# Patient Record
Sex: Female | Born: 1995 | Race: White | Hispanic: No | Marital: Single | Smoking: Current every day smoker
Health system: Southern US, Community
[De-identification: ages and names within clinical notes are randomized; demographics above are authoritative.]

## PROBLEM LIST (undated history)

## (undated) DIAGNOSIS — T7840XA Allergy, unspecified, initial encounter: Secondary | ICD-10-CM

## (undated) DIAGNOSIS — J45909 Unspecified asthma, uncomplicated: Secondary | ICD-10-CM

## (undated) DIAGNOSIS — L309 Dermatitis, unspecified: Secondary | ICD-10-CM

## (undated) DIAGNOSIS — M4646 Discitis, unspecified, lumbar region: Secondary | ICD-10-CM

## (undated) HISTORY — DX: Unspecified asthma, uncomplicated: J45.909

## (undated) HISTORY — DX: Dermatitis, unspecified: L30.9

## (undated) HISTORY — DX: Allergy, unspecified, initial encounter: T78.40XA

## (undated) HISTORY — DX: Discitis, unspecified, lumbar region: M46.46

---
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
## 2014-01-11 ENCOUNTER — Ambulatory Visit (INDEPENDENT_AMBULATORY_CARE_PROVIDER_SITE_OTHER): Payer: BC Managed Care – PPO | Admitting: Family Medicine

## 2014-01-11 ENCOUNTER — Encounter: Payer: Self-pay | Admitting: Family Medicine

## 2014-01-11 VITALS — BP 114/77 | HR 82 | Temp 97.1°F | Ht 64.25 in | Wt 160.0 lb

## 2014-01-11 DIAGNOSIS — T7840XA Allergy, unspecified, initial encounter: Secondary | ICD-10-CM

## 2014-01-11 MED ORDER — METHYLPREDNISOLONE ACETATE 80 MG/ML IJ SUSP
80.0000 mg | Freq: Once | INTRAMUSCULAR | Status: AC
  Administered 2014-01-11: 80 mg via INTRAMUSCULAR

## 2014-01-11 MED ORDER — METHYLPREDNISOLONE (PAK) 4 MG PO TABS: ORAL_TABLET | ORAL | Status: DC

## 2014-01-11 NOTE — Progress Notes (Signed)
   Subjective:    Patient ID: Janet White, female    DOB: [birth year], 18 y.o.   MRN: [id number]  HPI This 18 y.o. female presents for evaluation of rash and allergic rxn to some laundry detergent over the weekend.  She has been swelling in face.   Review of Systems C/o rash   No chest pain, SOB, HA, dizziness, vision change, N/V, diarrhea, constipation, dysuria, urinary urgency or frequency, myalgias, arthralgias or rash.  Objective:   Physical Exam  Vital signs noted  Well developed well nourished female.  HEENT - Head atraumatic Normocephalic                Eyes - PERRLA, Conjuctiva - clear Sclera- Clear EOMI                Ears - EAC's Wnl TM's Wnl Gross Hearing WNL                Throat - oropharanx wnl Respiratory - Lungs CTA bilateral Cardiac - RRR S1 and S2 without murmur GI - Abdomen soft Nontender and bowel sounds active x 4 Extremities - No edema. Neuro - Grossly intact. Skin - Face with swelling and bilateral antecubital region with erythematous dry rash     Assessment & Plan:  Allergic reaction, initial encounter - Plan: methylPREDNISolone acetate (DEPO-MEDROL) injection 80 mg, methylPREDNIsolone (MEDROL DOSPACK) 4 MG tablet  Take benadryl otc and discussed using epi pen when having angio edema or tongue swelling.  Deatra CanterWilliam J Clevland Cork FNP

## 2014-01-28 ENCOUNTER — Telehealth: Payer: Self-pay | Admitting: Nurse Practitioner

## 2014-01-28 NOTE — Telephone Encounter (Signed)
Need to see rash because if it is folliculitis that normally occurs in that area a steroid cream will not help -May need antibiotic- Can try OTC steroid cream and if doesn't help let me know

## 2014-01-28 NOTE — Telephone Encounter (Signed)
She has a rash between her legs that is flared up bad and they want to know if you can call her in a steroid cream. We have no available appts today

## 2014-01-28 NOTE — Telephone Encounter (Signed)
Patient aware appointment made 

## 2014-01-29 ENCOUNTER — Encounter (INDEPENDENT_AMBULATORY_CARE_PROVIDER_SITE_OTHER): Payer: Self-pay

## 2014-01-29 ENCOUNTER — Ambulatory Visit (INDEPENDENT_AMBULATORY_CARE_PROVIDER_SITE_OTHER): Payer: BC Managed Care – PPO | Admitting: Nurse Practitioner

## 2014-01-29 ENCOUNTER — Telehealth: Payer: Self-pay

## 2014-01-29 ENCOUNTER — Encounter: Payer: Self-pay | Admitting: Nurse Practitioner

## 2014-01-29 VITALS — BP 104/67 | HR 98 | Temp 96.9°F | Ht 64.25 in | Wt 158.0 lb

## 2014-01-29 DIAGNOSIS — L259 Unspecified contact dermatitis, unspecified cause: Secondary | ICD-10-CM

## 2014-01-29 DIAGNOSIS — L309 Dermatitis, unspecified: Secondary | ICD-10-CM

## 2014-01-29 MED ORDER — PREDNISONE 20 MG PO TABS: ORAL_TABLET | ORAL | Status: DC

## 2014-01-29 MED ORDER — METHYLPREDNISOLONE SODIUM SUCC 125 MG IJ SOLR
125.0000 mg | Freq: Once | INTRAMUSCULAR | Status: AC
  Administered 2014-01-29: 125 mg via INTRAMUSCULAR

## 2014-01-29 NOTE — Progress Notes (Signed)
   Subjective:    Patient ID: Charleston RopesMary Janet White, female    DOB: 1996/02/02, 18 y.o.   MRN: [id number]  HPI Patien tin c/o rash that started yesterday morning- Mainly between legs and on back of legs- very itchy.    Review of Systems  Constitutional: Negative.   HENT: Negative.   Respiratory: Negative.   Cardiovascular: Negative.   Genitourinary: Negative.   Neurological: Negative.   Psychiatric/Behavioral: Negative.   All other systems reviewed and are negative.      Objective:   Physical Exam  Constitutional: She is oriented to person, place, and time. She appears well-developed and well-nourished.  Cardiovascular: Normal rate, regular rhythm and normal heart sounds.   Pulmonary/Chest: Effort normal and breath sounds normal.  Neurological: She is alert and oriented to person, place, and time.  Skin: Skin is warm. Rash noted.  Erythematous whelpy rash on bil legs   BP 104/67  Pulse 98  Temp(Src) 96.9 F (36.1 C) (Oral)  Ht 5' 4.25" (1.632 m)  Wt 158 lb (71.668 kg)  BMI 26.91 kg/m2  LMP 01/08/2014        Assessment & Plan:  1. Contact dermatitis and other eczema, due to unspecified cause Avoid scratching Benadryl OTC if helps with itching - methylPREDNISolone sodium succinate (SOLU-MEDROL) 125 mg/2 mL injection 125 mg; Inject 2 mLs (125 mg total) into the muscle once. - predniSONE (DELTASONE) 20 MG tablet; 2 po qd x 5 days  Dispense: 10 tablet; Refill: 0  2. Eczema Continue current treatments - Ambulatory referral to Dermatology  Janet Daphine DeutscherMartin, FNP

## 2014-01-29 NOTE — Patient Instructions (Signed)
Eczema Eczema, also called atopic dermatitis, is a skin disorder that causes inflammation of the skin. It causes a red rash and dry, scaly skin. The skin becomes very itchy. Eczema is generally worse during the cooler winter months and often improves with the warmth of summer. Eczema usually starts showing signs in infancy. Some children outgrow eczema, but it may last through adulthood.  CAUSES  The exact cause of eczema is not known, but it appears to run in families. People with eczema often have a family history of eczema, allergies, asthma, or hay fever. Eczema is not contagious. Flare-ups of the condition may be caused by:   Contact with something you are sensitive or allergic to.   Stress. SIGNS AND SYMPTOMS  Dry, scaly skin.   Red, itchy rash.   Itchiness. This may occur before the skin rash and may be very intense.  DIAGNOSIS  The diagnosis of eczema is usually made based on symptoms and medical history. TREATMENT  Eczema cannot be cured, but symptoms usually can be controlled with treatment and other strategies. A treatment plan might include:  Controlling the itching and scratching.   Use over-the-counter antihistamines as directed for itching. This is especially useful at night when the itching tends to be worse.   Use over-the-counter steroid creams as directed for itching.   Avoid scratching. Scratching makes the rash and itching worse. It may also result in a skin infection (impetigo) due to a break in the skin caused by scratching.   Keeping the skin well moisturized with creams every day. This will seal in moisture and help prevent dryness. Lotions that contain alcohol and water should be avoided because they can dry the skin.   Limiting exposure to things that you are sensitive or allergic to (allergens).   Recognizing situations that cause stress.   Developing a plan to manage stress.  HOME CARE INSTRUCTIONS   Only take over-the-counter or  prescription medicines as directed by your health care provider.   Do not use anything on the skin without checking with your health care provider.   Keep baths or showers short (5 minutes) in warm (not hot) water. Use mild cleansers for bathing. These should be unscented. You may add nonperfumed bath oil to the bath water. It is best to avoid soap and bubble bath.   Immediately after a bath or shower, when the skin is still damp, apply a moisturizing ointment to the entire body. This ointment should be a petroleum ointment. This will seal in moisture and help prevent dryness. The thicker the ointment, the better. These should be unscented.   Keep fingernails cut short. Children with eczema may need to wear soft gloves or mittens at night after applying an ointment.   Dress in clothes made of cotton or cotton blends. Dress lightly, because heat increases itching.   A child with eczema should stay away from anyone with fever blisters or cold sores. The virus that causes fever blisters (herpes simplex) can cause a serious skin infection in children with eczema. SEEK MEDICAL CARE IF:   Your itching interferes with sleep.   Your rash gets worse or is not better within 1 week after starting treatment.   You see pus or soft yellow scabs in the rash area.   You have a fever.   You have a rash flare-up after contact with someone who has fever blisters.  Document Released: 06/01/2000 Document Revised: 03/25/2013 Document Reviewed: 01/05/2013 [employer] Patient Information 2015 [employer]. This information   is not intended to replace advice given to you by your health care provider. Make sure you discuss any questions you have with your health care provider.  

## 2014-01-29 NOTE — Telephone Encounter (Signed)
[facility] to Referrals-[facility]

## 2014-02-15 ENCOUNTER — Encounter: Payer: Self-pay | Admitting: Nurse Practitioner

## 2014-02-15 ENCOUNTER — Ambulatory Visit (INDEPENDENT_AMBULATORY_CARE_PROVIDER_SITE_OTHER): Payer: BC Managed Care – PPO | Admitting: Nurse Practitioner

## 2014-02-15 VITALS — BP 120/73 | HR 91 | Temp 98.3°F | Ht 64.0 in | Wt 160.0 lb

## 2014-02-15 DIAGNOSIS — L309 Dermatitis, unspecified: Secondary | ICD-10-CM

## 2014-02-15 DIAGNOSIS — L259 Unspecified contact dermatitis, unspecified cause: Secondary | ICD-10-CM

## 2014-02-15 MED ORDER — TRIAMCINOLONE ACETONIDE 0.1 % EX CREA: 1.0000 "application " | TOPICAL_CREAM | Freq: Two times a day (BID) | CUTANEOUS | Status: DC

## 2014-02-15 NOTE — Patient Instructions (Signed)
Eczema Eczema, also called atopic dermatitis, is a skin disorder that causes inflammation of the skin. It causes a red rash and dry, scaly skin. The skin becomes very itchy. Eczema is generally worse during the cooler winter months and often improves with the warmth of summer. Eczema usually starts showing signs in infancy. Some children outgrow eczema, but it may last through adulthood.  CAUSES  The exact cause of eczema is not known, but it appears to run in families. People with eczema often have a family history of eczema, allergies, asthma, or hay fever. Eczema is not contagious. Flare-ups of the condition may be caused by:   Contact with something you are sensitive or allergic to.   Stress. SIGNS AND SYMPTOMS  Dry, scaly skin.   Red, itchy rash.   Itchiness. This may occur before the skin rash and may be very intense.  DIAGNOSIS  The diagnosis of eczema is usually made based on symptoms and medical history. TREATMENT  Eczema cannot be cured, but symptoms usually can be controlled with treatment and other strategies. A treatment plan might include:  Controlling the itching and scratching.   Use over-the-counter antihistamines as directed for itching. This is especially useful at night when the itching tends to be worse.   Use over-the-counter steroid creams as directed for itching.   Avoid scratching. Scratching makes the rash and itching worse. It may also result in a skin infection (impetigo) due to a break in the skin caused by scratching.   Keeping the skin well moisturized with creams every day. This will seal in moisture and help prevent dryness. Lotions that contain alcohol and water should be avoided because they can dry the skin.   Limiting exposure to things that you are sensitive or allergic to (allergens).   Recognizing situations that cause stress.   Developing a plan to manage stress.  HOME CARE INSTRUCTIONS   Only take over-the-counter or  prescription medicines as directed by your health care provider.   Do not use anything on the skin without checking with your health care provider.   Keep baths or showers short (5 minutes) in warm (not hot) water. Use mild cleansers for bathing. These should be unscented. You may add nonperfumed bath oil to the bath water. It is best to avoid soap and bubble bath.   Immediately after a bath or shower, when the skin is still damp, apply a moisturizing ointment to the entire body. This ointment should be a petroleum ointment. This will seal in moisture and help prevent dryness. The thicker the ointment, the better. These should be unscented.   Keep fingernails cut short. Children with eczema may need to wear soft gloves or mittens at night after applying an ointment.   Dress in clothes made of cotton or cotton blends. Dress lightly, because heat increases itching.   A child with eczema should stay away from anyone with fever blisters or cold sores. The virus that causes fever blisters (herpes simplex) can cause a serious skin infection in children with eczema. SEEK MEDICAL CARE IF:   Your itching interferes with sleep.   Your rash gets worse or is not better within 1 week after starting treatment.   You see pus or soft yellow scabs in the rash area.   You have a fever.   You have a rash flare-up after contact with someone who has fever blisters.  Document Released: 06/01/2000 Document Revised: 03/25/2013 Document Reviewed: 01/05/2013 [employer] Patient Information 2015 [employer]. This information   is not intended to replace advice given to you by your health care provider. Make sure you discuss any questions you have with your health care provider.  

## 2014-02-15 NOTE — Progress Notes (Signed)
   Subjective:    Patient ID: Charleston Ropes, female    DOB: January 07, 1996, 18 y.o.   MRN: [id number]  HPI Patient in today with a flare up of her eczema- mainly i n anticubital areas.    Review of Systems  Constitutional: Negative.   HENT: Negative.   Respiratory: Negative.   Cardiovascular: Negative.   Genitourinary: Negative.   Neurological: Negative.   Psychiatric/Behavioral: Negative.   All other systems reviewed and are negative.      Objective:   Physical Exam  Constitutional: She is oriented to person, place, and time. She appears well-developed and well-nourished.  Cardiovascular: Normal rate, regular rhythm and normal heart sounds.   Neurological: She is alert and oriented to person, place, and time.  Skin: Skin is warm.  Erythematous dry patchy area to bil antecubital area  Psychiatric: She has a normal mood and affect. Her behavior is normal. Judgment and thought content normal.   BP 120/73  Pulse 91  Temp(Src) 98.3 F (36.8 C) (Oral)  Ht  (1.626 m)  Wt 160 lb (72.576 kg)  BMI 27.45 kg/m2        Assessment & Plan:   1. Eczema    Meds ordered this encounter  Medications  . triamcinolone cream (KENALOG) 0.1 %    Sig: Apply 1 application topically 2 (two) times daily.    Dispense:  85.2 g    Refill:  2    Order Specific Question:  Supervising Provider    Answer:  Ernestina Penna [[id number]]   Avoid taking hot baths RTO prn- follow up with dermatology  Kaleisha-Margaret Daphine Deutscher, FNP

## 2014-03-13 ENCOUNTER — Other Ambulatory Visit: Payer: Self-pay | Admitting: Nurse Practitioner

## 2014-08-08 ENCOUNTER — Other Ambulatory Visit: Payer: Self-pay | Admitting: Physician Assistant

## 2014-08-09 NOTE — Telephone Encounter (Signed)
Last seen 02/15/14 MMM

## 2014-10-17 ENCOUNTER — Other Ambulatory Visit: Payer: Self-pay | Admitting: Nurse Practitioner

## 2014-11-05 ENCOUNTER — Encounter (INDEPENDENT_AMBULATORY_CARE_PROVIDER_SITE_OTHER): Payer: Self-pay

## 2014-11-05 ENCOUNTER — Ambulatory Visit (INDEPENDENT_AMBULATORY_CARE_PROVIDER_SITE_OTHER): Payer: BC Managed Care – PPO | Admitting: Nurse Practitioner

## 2014-11-05 ENCOUNTER — Encounter: Payer: Self-pay | Admitting: Nurse Practitioner

## 2014-11-05 VITALS — BP 110/66 | HR 82 | Temp 96.8°F | Ht 64.0 in | Wt 158.0 lb

## 2014-11-05 DIAGNOSIS — J029 Acute pharyngitis, unspecified: Secondary | ICD-10-CM

## 2014-11-05 DIAGNOSIS — J309 Allergic rhinitis, unspecified: Secondary | ICD-10-CM

## 2014-11-05 LAB — POCT RAPID STREP A (OFFICE): Rapid Strep A Screen: NEGATIVE

## 2014-11-05 MED ORDER — FLUTICASONE PROPIONATE 50 MCG/ACT NA SUSP: 2.0000 | Freq: Every day | NASAL | Status: DC

## 2014-11-05 MED ORDER — METHYLPREDNISOLONE ACETATE 80 MG/ML IJ SUSP
80.0000 mg | Freq: Once | INTRAMUSCULAR | Status: AC
  Administered 2014-11-05: 80 mg via INTRAMUSCULAR

## 2014-11-05 NOTE — Progress Notes (Signed)
  Subjective:     Charleston RopesMary Graley is a [age] y.o. female who presents for evaluation of sore throat. Associated symptoms include headache, nasal blockage, post nasal drip, sinus and nasal congestion and sore throat. Onset of symptoms was 2 days ago, and have been unchanged since that time. She is drinking plenty of fluids. She has not had a recent close exposure to someone with proven streptococcal pharyngitis.  The following portions of the patient's history were reviewed and updated as appropriate: allergies, current medications, past family history, past medical history, past social history, past surgical history and problem list.  Review of Systems Pertinent items are noted in HPI.    Objective:    BP 110/66 mmHg  Pulse 82  Temp(Src) 96.8 F (36 C) (Oral)  Ht 5\' 4"  (1.626 m)  Wt 158 lb (71.668 kg)  BMI 27.11 kg/m2 General appearance: alert and cooperative Eyes: conjunctivae/corneas clear. PERRL, EOM's intact. Fundi benign. Ears: normal TM's and external ear canals both ears Nose: Nares normal. Septum midline. Mucosa normal. No drainage or sinus tenderness. Throat: lips, mucosa, and tongue normal; teeth and gums normal and posterior oral pharynx erythematous Neck: no adenopathy, no carotid bruit, no JVD, supple, symmetrical, trachea midline and thyroid not enlarged, symmetric, no tenderness/mass/nodules Lungs: clear to auscultation bilaterally Heart: regular rate and rhythm, S1, S2 normal, no murmur, click, rub or gallop  Laboratory Strep test done. Results:.    Results for orders placed or performed in visit on 11/05/14  POCT rapid strep A  Result Value Ref Range   Rapid Strep A Screen Negative Negative     Assessment:    Acute pharyngitis,   Plan:  1. Take meds as prescribed 2. Use a cool mist humidifier especially during the winter months and when heat has been humid. 3. Use saline nose sprays frequently 4. Saline irrigations of the nose can be very helpful if done  frequently.  * 4X daily for 1 week*  * Use of a nettie pot can be helpful with this. Follow directions with this* 5. Drink plenty of fluids 6. Keep thermostat turn down low 7.For any cough or congestion  Use plain Mucinex- regular strength or max strength is fine   * Children- consult with Pharmacist for dosing 8. For fever or aces or pains- take tylenol or ibuprofen appropriate for age and weight.  * for fevers greater than 101 orally you may alternate ibuprofen and tylenol every  3 hours.   Meds ordered this encounter  Medications  . methylPREDNISolone acetate (DEPO-MEDROL) injection 80 mg    Sig:   . fluticasone (FLONASE) 50 MCG/ACT nasal spray    Sig: Place 2 sprays into both nostrils daily.    Dispense:  16 g    Refill:  6    Order Specific Question:  Supervising Provider    Answer:  Ernestina PennaMOORE, DONALD W [[id number]]   Lynnix-Margaret Daphine DeutscherMartin, FNP

## 2014-11-05 NOTE — Patient Instructions (Signed)

## 2014-11-09 ENCOUNTER — Telehealth: Payer: Self-pay | Admitting: Nurse Practitioner

## 2014-11-09 NOTE — Telephone Encounter (Signed)
Patient mother aware. 

## 2014-11-09 NOTE — Telephone Encounter (Signed)
If having no fever needs to take a decongestant otc

## 2014-11-18 ENCOUNTER — Other Ambulatory Visit: Payer: Self-pay | Admitting: Physician Assistant

## 2014-11-18 DIAGNOSIS — L209 Atopic dermatitis, unspecified: Secondary | ICD-10-CM

## 2014-11-18 DIAGNOSIS — L089 Local infection of the skin and subcutaneous tissue, unspecified: Secondary | ICD-10-CM

## 2014-11-18 DIAGNOSIS — B958 Unspecified staphylococcus as the cause of diseases classified elsewhere: Secondary | ICD-10-CM

## 2014-11-18 MED ORDER — CLOBETASOL PROPIONATE 0.05 % EX OINT: TOPICAL_OINTMENT | Freq: Two times a day (BID) | CUTANEOUS | Status: DC

## 2014-11-18 MED ORDER — TRIAMCINOLONE ACETONIDE 0.1 % EX CREA: 1.0000 "application " | TOPICAL_CREAM | Freq: Two times a day (BID) | CUTANEOUS | Status: DC

## 2014-11-18 MED ORDER — DOXYCYCLINE HYCLATE 100 MG PO TABS: 100.0000 mg | ORAL_TABLET | Freq: Two times a day (BID) | ORAL | Status: DC

## 2014-11-18 MED ORDER — HYDROXYZINE HCL 25 MG PO TABS: ORAL_TABLET | ORAL | Status: DC

## 2014-12-14 ENCOUNTER — Other Ambulatory Visit: Payer: Self-pay | Admitting: Nurse Practitioner

## 2015-02-11 ENCOUNTER — Encounter (INDEPENDENT_AMBULATORY_CARE_PROVIDER_SITE_OTHER): Payer: Self-pay

## 2015-02-11 ENCOUNTER — Encounter: Payer: Self-pay | Admitting: Physician Assistant

## 2015-02-11 ENCOUNTER — Ambulatory Visit: Payer: BLUE CROSS/BLUE SHIELD | Admitting: Physician Assistant

## 2015-02-11 VITALS — BP 116/67 | HR 83 | Temp 97.1°F | Ht 64.01 in | Wt 156.0 lb

## 2015-02-11 DIAGNOSIS — R5383 Other fatigue: Secondary | ICD-10-CM

## 2015-02-11 DIAGNOSIS — B360 Pityriasis versicolor: Secondary | ICD-10-CM

## 2015-02-11 DIAGNOSIS — L089 Local infection of the skin and subcutaneous tissue, unspecified: Secondary | ICD-10-CM

## 2015-02-11 DIAGNOSIS — L209 Atopic dermatitis, unspecified: Secondary | ICD-10-CM

## 2015-02-11 MED ORDER — CIPROFLOXACIN HCL 500 MG PO TABS: 500.0000 mg | ORAL_TABLET | Freq: Two times a day (BID) | ORAL | Status: DC

## 2015-02-11 MED ORDER — FLUCONAZOLE 150 MG PO TABS: ORAL_TABLET | ORAL | Status: DC

## 2015-02-11 MED ORDER — SULFAMETHOXAZOLE-TRIMETHOPRIM 800-160 MG PO TABS: 1.0000 | ORAL_TABLET | Freq: Two times a day (BID) | ORAL | Status: DC

## 2015-02-11 MED ORDER — MUPIROCIN 2 % EX OINT: TOPICAL_OINTMENT | CUTANEOUS | Status: DC

## 2015-02-11 MED ORDER — TACROLIMUS 0.1 % EX OINT
TOPICAL_OINTMENT | Freq: Two times a day (BID) | CUTANEOUS | Status: DC
Start: 2015-02-11 — End: 2015-04-25

## 2015-02-11 MED ORDER — CLOBETASOL PROPIONATE 0.05 % EX OINT: TOPICAL_OINTMENT | Freq: Two times a day (BID) | CUTANEOUS | Status: DC

## 2015-02-11 NOTE — Patient Instructions (Addendum)
Take both antibiotics x 10 days - Cipro and Bactrim Use Clobetasol twice daily on hands and the worst areas of eczema x 14 - change to triamcinolone twice daily  For maintenance, use Triamcinolone and Protopic Mupirocin/Bactroban Ointment - use on areas of skin that breakdown and could become or look infected   Dove soap  Yeast Infection of the Skin Some yeast on the skin is normal, but sometimes it causes an infection. If you have a yeast infection, it shows up as white or light brown patches on brown skin. You can see it better in the summer on tan skin. It causes light-colored holes in your suntan. It can happen on any area of the body. This cannot be passed from person to person. HOME CARE  Scrub your skin daily with a dandruff shampoo. Your rash may take a couple weeks to get well.  Do not scratch or itch the rash. GET HELP RIGHT AWAY IF:   You get another infection from scratching. The skin may get warm, red, and may ooze fluid.  The infection does not seem to be getting better. MAKE SURE YOU:  Understand these instructions.  Will watch your condition.  Will get help right away if you are not doing well or get worse. Document Released: 05/17/2008 Document Revised: 08/27/2011 Document Reviewed: 05/17/2008 [facility] Patient Information 2015 [place], [place]. This information is not intended to replace advice given to you by your health care provider. Make sure you discuss any questions you have with your health care provider.

## 2015-02-11 NOTE — Progress Notes (Signed)
   Subjective:    Patient ID: Janet White, female    DOB: 1995-08-24, 19 y.o.   MRN: [id number]  HPI 19 y/o with h/o eczema presents with flare and skin irritation, mostly on hands.   C/O rash on BLE, trunk x several months, spreading.   She also has c/o increased fatigue. She states that she is sleeping 12 hours a day and still tired.   Review of Systems  Skin:       Rash on BLE and trunk, light colored splotchy Hands are cracking and very erythematous in areas, appears to have secondary infection          Objective:   Physical Exam  Skin:  Scaling plaques on BUE Cracking and fissures on bilateral hands due to irritation from washing dishes - hand dermatitis Large amount of erythema on hands, suspicious for secondary infection Hypopigment patches with scale on BLE and trunk          Assessment & Plan:  1. Atopic dermatitis  - tacrolimus (PROTOPIC) 0.1 % ointment; Apply topically 2 (two) times daily.  Dispense: 100 g; Refill: 3 - clobetasol ointment (TEMOVATE) 0.05 %; Apply topically 2 (two) times daily. X 14 days , then change to triamcinolone  Dispense: 60 g; Refill: 2  - bleach baths 2-3 times weekly   2. Tinea versicolor  - fluconazole (DIFLUCAN) 150 MG tablet; Take 1 tablet on the same day of the week x 6 weeks  Dispense: 6 tablet; Refill: 0 - OTC Nizoral shampoo - use as body wash daily   3. Skin infection - mupirocin ointment (BACTROBAN) 2 %; Apply to AA BID x 14 days  Dispense: 30 g; Refill: 0 - Bactrim DS BID x 10 days - Cipro  BID x 10 days Did not culture d/t patient not having insurance but provided double coverage based on past culture results.   4. Other fatigue - Patient will return when her insurance is in effect for further lab testing to r/o anemia, hypothyroidism   RTO if infection does not clear and symptoms worsen. Instructions were given to Use Clobetasol twice daily on hands and the worst areas of eczema x 14 - change to triamcinolone  twice daily   For maintenance, use Triamcinolone and Protopic Mupirocin/Bactroban Ointment - use on areas of skin that breakdown and could become or look infected   Ellenie Salome A. Chauncey Reading PA-C

## 2015-04-25 ENCOUNTER — Encounter: Payer: Self-pay | Admitting: Pediatrics

## 2015-04-25 ENCOUNTER — Telehealth: Payer: Self-pay | Admitting: Nurse Practitioner

## 2015-04-25 ENCOUNTER — Ambulatory Visit (INDEPENDENT_AMBULATORY_CARE_PROVIDER_SITE_OTHER): Payer: Self-pay | Admitting: Pediatrics

## 2015-04-25 ENCOUNTER — Ambulatory Visit: Payer: Self-pay

## 2015-04-25 VITALS — BP 113/73 | HR 89 | Temp 98.7°F | Ht 64.01 in | Wt 156.0 lb

## 2015-04-25 DIAGNOSIS — L509 Urticaria, unspecified: Secondary | ICD-10-CM

## 2015-04-25 DIAGNOSIS — L209 Atopic dermatitis, unspecified: Secondary | ICD-10-CM

## 2015-04-25 DIAGNOSIS — J4540 Moderate persistent asthma, uncomplicated: Secondary | ICD-10-CM

## 2015-04-25 DIAGNOSIS — L309 Dermatitis, unspecified: Secondary | ICD-10-CM

## 2015-04-25 MED ORDER — CLOBETASOL PROPIONATE 0.05 % EX OINT: 1.0000 "application " | TOPICAL_OINTMENT | Freq: Two times a day (BID) | CUTANEOUS | Status: DC

## 2015-04-25 MED ORDER — MONTELUKAST SODIUM 10 MG PO TABS: 10.0000 mg | ORAL_TABLET | Freq: Every day | ORAL | Status: DC

## 2015-04-25 MED ORDER — TRIAMCINOLONE ACETONIDE 0.1 % EX CREA: 1.0000 "application " | TOPICAL_CREAM | Freq: Two times a day (BID) | CUTANEOUS | Status: DC

## 2015-04-25 MED ORDER — FEXOFENADINE HCL 30 MG PO TABS: 180.0000 mg | ORAL_TABLET | Freq: Every day | ORAL | Status: DC

## 2015-04-25 MED ORDER — ALBUTEROL SULFATE HFA 108 (90 BASE) MCG/ACT IN AERS: 2.0000 | INHALATION_SPRAY | Freq: Four times a day (QID) | RESPIRATORY_TRACT | Status: DC | PRN

## 2015-04-25 MED ORDER — HYDROXYZINE HCL 25 MG PO TABS: ORAL_TABLET | ORAL | Status: DC

## 2015-04-25 MED ORDER — FEXOFENADINE HCL 30 MG PO TABS: 180.0000 mg | ORAL_TABLET | Freq: Two times a day (BID) | ORAL | Status: DC

## 2015-04-25 MED ORDER — PREDNISONE 10 MG (21) PO TBPK: 10.0000 mg | ORAL_TABLET | Freq: Every day | ORAL | Status: DC

## 2015-04-25 NOTE — Progress Notes (Signed)
Subjective:    Patient ID: Janet White, female    DOB: August 10, 1995, 19 y.o.   MRN: [id number]  CC: hives  HPI: Janet White is a 19 y.o. female presenting on 04/25/2015 for Urticaria  Started getting hives 7 days ago Very itchy Hives improve/worsen on different parts of body within hours Yesterday her lower back had many lesions, pt with photo on her phone, today it is more her legs Trying benadryl and triamcinolone at home H/o eczema H/o allergies, has congestion now No new detergents, foods, other new exposures Got hives two weeks ago that were similar, lasted a few days  Taking benadryl twice a day now Allegra daily  Relevant past medical, surgical, family and social history reviewed and updated as indicated. Interim medical history since our last visit reviewed. Allergies and medications reviewed and updated.   ROS: Per HPI unless specifically indicated above  Past Medical History Patient Active Problem List   Diagnosis Date Noted  . Eczema 12/12/2012    Current Outpatient Prescriptions  Medication Sig Dispense Refill  . albuterol (VENTOLIN HFA) 108 (90 BASE) MCG/ACT inhaler Inhale 2 puffs into the lungs every 6 (six) hours as needed for wheezing. 1 Inhaler 2  . EPINEPHrine (EPI-PEN) 0.3 mg/0.3 mL SOAJ Inject 0.3 mLs (0.3 mg total) into the muscle once. 1 Device 11  . FLOVENT HFA 110 MCG/ACT inhaler     . hydrOXYzine (ATARAX/VISTARIL) 25 MG tablet Take 1 tablet TID prn for itch 90 tablet 1  . montelukast (SINGULAIR) 10 MG tablet Take 1 tablet (10 mg total) by mouth at bedtime. 30 tablet 5  . triamcinolone cream (KENALOG) 0.1 % Apply 1 application topically 2 (two) times daily. 453.6 g 2  . clobetasol ointment (TEMOVATE) 0.05 % Apply 1 application topically 2 (two) times daily. 60 g 1  . fexofenadine (ALLEGRA) 30 MG tablet Take 6 tablets (180 mg total) by mouth daily. 1 tablet 0  . predniSONE (STERAPRED UNI-PAK 21 TAB) 10 MG (21) TBPK tablet Take 1 tablet (10 mg total)  by mouth daily. As directed x 6 days 21 tablet 0   No current facility-administered medications for this visit.       Objective:    BP 113/73 mmHg  Pulse 89  Temp(Src) 98.7 F (37.1 C) (Oral)  Ht 5' 4.01" (1.626 m)  Wt 156 lb (70.761 kg)  BMI 26.76 kg/m2  Wt Readings from Last 3 Encounters:  04/25/15 156 lb (70.761 kg)  02/11/15 156 lb (70.761 kg) (85 %*, Z = 1.02)  11/05/14 158 lb (71.668 kg) (86 %*, Z = 1.09)   * Growth percentiles are based on CDC 2-20 Years data.    Gen: NAD, alert, cooperative with exam, NCAT EYES: EOMI, no scleral injection or icterus ENT:  TMs pearly gray b/l, OP without erythema LYMPH: no cervical LAD CV: NRRR, normal S1/S2, no murmur, distal pulses 2+ b/l Resp: CTABL, no wheezes, normal WOB Abd: +BS, soft, NTND. no guarding or organomegaly Neuro: Alert and oriented Skin: raised, serpentine, urticarial plaques over fronts and backs of legs b/l, plaques coalescing together in some areas. With lichenified patches of skin over wrists b/l with excoriations     Assessment & Plan:   Janet White was seen today for acute urticaria, ongoing 1 week. Discussed possible etiologies, including exposures, viruses. Pt already on allegra, can take benadryl or atarax throughout day as needed for itching. Eczema not well controlled with triamcinolone, will try clobetasol. Will also give prednisone taper which can help  modulate histamine release in urticarial reactions. If symptoms continue will get her in to see allergy/immunology.  Diagnoses and all orders for this visit:  Eczema Not well controlled.  Try clobetasol, dont use on face or groin, thin skinned areas. Has enough triamcinolone at home to use as needed on those places. -     clobetasol ointment (TEMOVATE) 0.05 %; Apply 1 application topically 2 (two) times daily.  Hives See above, new problem. Not well controlled.  -     predniSONE (STERAPRED UNI-PAK 21 TAB) 10 MG (21) TBPK tablet; Take 1 tablet (10 mg total) by  mouth daily. As directed x 6 days -     fexofenadine (ALLEGRA) 30 MG tablet; Take 6 tablets (180 mg total) by mouth daily.  Asthma, moderate persistent, uncomplicated Symptoms well controlled now. Refill albuterol. No h/o hospitalizations for asthma. -     albuterol (VENTOLIN HFA) 108 (90 BASE) MCG/ACT inhaler; Inhale 2 puffs into the lungs every 6 (six) hours as needed for wheezing. -     montelukast (SINGULAIR) 10 MG tablet; Take 1 tablet (10 mg total) by mouth at bedtime.   Follow up plan: Return in about 4 weeks (around 05/23/2015).  Rex Kras, MD [facility] 04/25/2015, 4:45 PM

## 2015-04-25 NOTE — Telephone Encounter (Signed)
Pt given appt this afternoon with Dr.Vincent at 6:30.

## 2015-05-09 ENCOUNTER — Ambulatory Visit (INDEPENDENT_AMBULATORY_CARE_PROVIDER_SITE_OTHER): Payer: Self-pay | Admitting: Family Medicine

## 2015-05-09 ENCOUNTER — Encounter: Payer: Self-pay | Admitting: Family Medicine

## 2015-05-09 ENCOUNTER — Telehealth: Payer: Self-pay | Admitting: Nurse Practitioner

## 2015-05-09 VITALS — BP 113/75 | HR 107 | Temp 97.2°F | Ht 64.0 in | Wt 154.0 lb

## 2015-05-09 DIAGNOSIS — L500 Allergic urticaria: Secondary | ICD-10-CM

## 2015-05-09 MED ORDER — PREDNISONE 20 MG PO TABS: ORAL_TABLET | ORAL | Status: DC

## 2015-05-09 MED ORDER — BETAMETHASONE SOD PHOS & ACET 6 (3-3) MG/ML IJ SUSP
9.0000 mg | Freq: Once | INTRAMUSCULAR | Status: AC
  Administered 2015-05-09: 9 mg via INTRAMUSCULAR

## 2015-05-09 NOTE — Progress Notes (Signed)
Subjective:  Patient ID: Charleston Ropes, female    DOB: 10-02-1995  Age: 19 y.o. MRN: [id number]  CC: Urticaria   HPI Ulah Olmo presents for onset last night of urticaria. It started on the legs and spread to the buttocks where she had had a latex Band-Aid applied. It has spread onto the abdomen and back as well as down the legs and onto the face and neck. The patient denies any systemic symptoms such as shortness of breath or tremor or anxiety. She is able to swallow. She had a previous incident on the sixth of this month treated here. Mom states that prednisone helped a little bit but felt it was just a Band-Aid. Unfortunately the patient has had a long-term history of allergy and eczema. She is under consideration for allergy desensitization shots. Mom states they're just trying to work out the logistics of trying to drive back and forth to [place] or possibly [place] to get her started on the shots. She has a lot of breaking out from the eczema usually but that is separate from the urticaria currently.  History Kaedynce has a past medical history of Allergy; Eczema; and Asthma.   She has no past surgical history on file.   Her family history includes Cancer in her maternal grandmother and mother.She reports that she has been smoking Cigarettes.  She has been smoking about 0.50 packs per day. She does not have any smokeless tobacco history on file. She reports that she does not drink alcohol or use illicit drugs.  Outpatient Prescriptions Prior to Visit  Medication Sig Dispense Refill  . clobetasol ointment (TEMOVATE) 0.05 % Apply 1 application topically 2 (two) times daily. 60 g 1  . EPINEPHrine (EPI-PEN) 0.3 mg/0.3 mL SOAJ Inject 0.3 mLs (0.3 mg total) into the muscle once. 1 Device 11  . fexofenadine (ALLEGRA) 30 MG tablet Take 6 tablets (180 mg total) by mouth daily. 1 tablet 0  . hydrOXYzine (ATARAX/VISTARIL) 25 MG tablet Take 1 tablet TID prn for itch 90 tablet 1  . montelukast  (SINGULAIR) 10 MG tablet Take 1 tablet (10 mg total) by mouth at bedtime. 30 tablet 5  . triamcinolone cream (KENALOG) 0.1 % Apply 1 application topically 2 (two) times daily. 453.6 g 2  . predniSONE (DELTASONE) 20 MG tablet 2 po at sametime daily for 5 days 10 tablet 0  . albuterol (VENTOLIN HFA) 108 (90 BASE) MCG/ACT inhaler Inhale 2 puffs into the lungs every 6 (six) hours as needed for wheezing. (Patient not taking: Reported on 05/09/2015) 1 Inhaler 2  . FLOVENT HFA 110 MCG/ACT inhaler     . predniSONE (STERAPRED UNI-PAK 21 TAB) 10 MG (21) TBPK tablet Take 1 tablet (10 mg total) by mouth daily. As directed x 6 days (Patient not taking: Reported on 05/09/2015) 21 tablet 0   No facility-administered medications prior to visit.    ROS Review of Systems  Constitutional: Negative for fever, activity change and appetite change.  HENT: Negative for congestion, rhinorrhea and sore throat.   Eyes: Negative for pain and visual disturbance.  Respiratory: Negative for cough and shortness of breath.   Gastrointestinal: Negative for nausea and abdominal pain.  Musculoskeletal: Negative for myalgias and arthralgias.  Skin: Positive for color change and rash.    Objective:  BP 113/75 mmHg  Pulse 107  Temp(Src) 97.2 F (36.2 C) (Oral)  Ht  (1.626 m)  Wt 154 lb (69.854 kg)  BMI 26.42 kg/m2  SpO2 100%  LMP 04/30/2015  BP Readings from Last 3 Encounters:  05/09/15 113/75  04/25/15 113/73  02/11/15 116/67    Wt Readings from Last 3 Encounters:  05/09/15 154 lb (69.854 kg)  04/25/15 156 lb (70.761 kg)  02/11/15 156 lb (70.761 kg) (85 %*, Z = 1.02)   * Growth percentiles are based on CDC 2-20 Years data.     Physical Exam  Constitutional: She is oriented to person, place, and time. She appears well-developed and well-nourished. No distress.  HENT:  Head: Normocephalic and atraumatic.  Eyes: Conjunctivae are normal. Pupils are equal, round, and reactive to light.  Neck: Normal  range of motion. Neck supple.  Cardiovascular: Normal rate, regular rhythm and normal heart sounds.   No murmur heard. Pulmonary/Chest: Effort normal and breath sounds normal. No respiratory distress. She has no wheezes. She has no rales.  Abdominal: Soft. She exhibits no distension.  Musculoskeletal: Normal range of motion.  Neurological: She is alert and oriented to person, place, and time.  Skin: Skin is warm and dry. Rash (widespread raised blanching erythematous eruption on much of trunk and all ext.s Milder on face and neck) noted.  Psychiatric: She has a normal mood and affect. Her behavior is normal. Judgment and thought content normal.    No results found for: HGBA1C  No results found for: WBC, HGB, HCT, PLT, GLUCOSE, CHOL, TRIG, HDL, LDLDIRECT, LDLCALC, ALT, AST, NA, K, CL, CREATININE, BUN, CO2, TSH, PSA, INR, GLUF, HGBA1C, MICROALBUR  Patient was never admitted.  Assessment & Plan:   Corrie DandyMary was seen today for urticaria.  Diagnoses and all orders for this visit:  Allergic urticaria -     betamethasone acetate-betamethasone sodium phosphate (CELESTONE) injection 9 mg; Inject 1.5 mLs (9 mg total) into the muscle once.   I have discontinued Ms. Zanella's predniSONE and predniSONE. I am also having her maintain her EPINEPHrine, FLOVENT HFA, hydrOXYzine, albuterol, triamcinolone cream, montelukast, fexofenadine, and clobetasol ointment. We will continue to administer betamethasone acetate-betamethasone sodium phosphate.  Meds ordered this encounter  Medications  . betamethasone acetate-betamethasone sodium phosphate (CELESTONE) injection 9 mg    Sig:      Follow-up: Return if symptoms worsen or fail to improve.  Mechele ClaudeWarren Maizee Reinhold, M.D.

## 2015-05-09 NOTE — Telephone Encounter (Signed)
Stp and advised rx sent in.

## 2015-05-09 NOTE — Patient Instructions (Signed)
Avoid Phenergan. Consider expediting the process of allergic desensitization injections. Monitor for shortness of breath, tongue and throat swelling and seek emergency help if those occur

## 2015-05-09 NOTE — Telephone Encounter (Signed)
rx sent to pharmacy

## 2015-05-11 ENCOUNTER — Telehealth: Payer: Self-pay | Admitting: Family Medicine

## 2015-05-11 MED ORDER — PREDNISONE 10 MG PO TABS: ORAL_TABLET | ORAL | Status: DC

## 2015-05-11 NOTE — Telephone Encounter (Signed)
Done, please tell patient/mom

## 2015-05-11 NOTE — Telephone Encounter (Signed)
Medication called in patient aware 

## 2015-05-24 ENCOUNTER — Ambulatory Visit: Payer: Self-pay

## 2015-07-22 ENCOUNTER — Ambulatory Visit (INDEPENDENT_AMBULATORY_CARE_PROVIDER_SITE_OTHER): Payer: BLUE CROSS/BLUE SHIELD | Admitting: Family Medicine

## 2015-07-22 ENCOUNTER — Encounter: Payer: Self-pay | Admitting: Family Medicine

## 2015-07-22 VITALS — BP 113/72 | HR 105 | Temp 97.4°F | Ht 64.0 in | Wt 155.8 lb

## 2015-07-22 DIAGNOSIS — S098XXA Other specified injuries of head, initial encounter: Secondary | ICD-10-CM | POA: Diagnosis not present

## 2015-07-22 NOTE — Patient Instructions (Signed)
Monitor for signs and symptoms of head injury including confusion and lack of coordination nausea and vomiting.
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
## 2015-07-29 ENCOUNTER — Other Ambulatory Visit: Payer: Self-pay

## 2015-07-29 DIAGNOSIS — S0990XA Unspecified injury of head, initial encounter: Secondary | ICD-10-CM

## 2015-09-20 DIAGNOSIS — L810 Postinflammatory hyperpigmentation: Secondary | ICD-10-CM | POA: Diagnosis not present

## 2015-09-20 DIAGNOSIS — L700 Acne vulgaris: Secondary | ICD-10-CM | POA: Diagnosis not present

## 2015-09-20 DIAGNOSIS — L2084 Intrinsic (allergic) eczema: Secondary | ICD-10-CM | POA: Diagnosis not present

## 2015-12-19 ENCOUNTER — Ambulatory Visit (INDEPENDENT_AMBULATORY_CARE_PROVIDER_SITE_OTHER): Payer: BLUE CROSS/BLUE SHIELD | Admitting: Family

## 2015-12-19 ENCOUNTER — Encounter: Payer: Self-pay | Admitting: Family

## 2015-12-19 VITALS — BP 117/74 | HR 68 | Temp 98.5°F | Ht 64.0 in | Wt 154.8 lb

## 2015-12-19 DIAGNOSIS — L309 Dermatitis, unspecified: Secondary | ICD-10-CM | POA: Diagnosis not present

## 2015-12-19 DIAGNOSIS — L089 Local infection of the skin and subcutaneous tissue, unspecified: Secondary | ICD-10-CM

## 2015-12-19 DIAGNOSIS — B9689 Other specified bacterial agents as the cause of diseases classified elsewhere: Secondary | ICD-10-CM

## 2015-12-19 DIAGNOSIS — A499 Bacterial infection, unspecified: Secondary | ICD-10-CM

## 2015-12-19 MED ORDER — AMOXICILLIN 875 MG PO TABS: 875.0000 mg | ORAL_TABLET | Freq: Two times a day (BID) | ORAL | Status: DC

## 2015-12-19 MED ORDER — METHYLPREDNISOLONE ACETATE 80 MG/ML IJ SUSP
80.0000 mg | Freq: Once | INTRAMUSCULAR | Status: AC
  Administered 2015-12-19: 80 mg via INTRAMUSCULAR

## 2015-12-19 NOTE — Patient Instructions (Signed)
Eczema Eczema, also called atopic dermatitis, is a skin disorder that causes inflammation of the skin. It causes a red rash and dry, scaly skin. The skin becomes very itchy. Eczema is generally worse during the cooler winter months and often improves with the warmth of summer. Eczema usually starts showing signs in infancy. Some children outgrow eczema, but it may last through adulthood.  CAUSES  The exact cause of eczema is not known, but it appears to run in families. People with eczema often have a family history of eczema, allergies, asthma, or hay fever. Eczema is not contagious. Flare-ups of the condition may be caused by:   Contact with something you are sensitive or allergic to.   Stress. SIGNS AND SYMPTOMS  Dry, scaly skin.   Red, itchy rash.   Itchiness. This may occur before the skin rash and may be very intense.  DIAGNOSIS  The diagnosis of eczema is usually made based on symptoms and medical history. TREATMENT  Eczema cannot be cured, but symptoms usually can be controlled with treatment and other strategies. A treatment plan might include:  Controlling the itching and scratching.   Use over-the-counter antihistamines as directed for itching. This is especially useful at night when the itching tends to be worse.   Use over-the-counter steroid creams as directed for itching.   Avoid scratching. Scratching makes the rash and itching worse. It may also result in a skin infection (impetigo) due to a break in the skin caused by scratching.   Keeping the skin well moisturized with creams every day. This will seal in moisture and help prevent dryness. Lotions that contain alcohol and water should be avoided because they can dry the skin.   Limiting exposure to things that you are sensitive or allergic to (allergens).   Recognizing situations that cause stress.   Developing a plan to manage stress.  HOME CARE INSTRUCTIONS   Only take over-the-counter or  prescription medicines as directed by your health care provider.   Do not use anything on the skin without checking with your health care provider.   Keep baths or showers short (5 minutes) in warm (not hot) water. Use mild cleansers for bathing. These should be unscented. You may add nonperfumed bath oil to the bath water. It is best to avoid soap and bubble bath.   Immediately after a bath or shower, when the skin is still damp, apply a moisturizing ointment to the entire body. This ointment should be a petroleum ointment. This will seal in moisture and help prevent dryness. The thicker the ointment, the better. These should be unscented.   Keep fingernails cut short. Children with eczema may need to wear soft gloves or mittens at night after applying an ointment.   Dress in clothes made of cotton or cotton blends. Dress lightly, because heat increases itching.   A child with eczema should stay away from anyone with fever blisters or cold sores. The virus that causes fever blisters (herpes simplex) can cause a serious skin infection in children with eczema. SEEK MEDICAL CARE IF:   Your itching interferes with sleep.   Your rash gets worse or is not better within 1 week after starting treatment.   You see pus or soft yellow scabs in the rash area.   You have a fever.   You have a rash flare-up after contact with someone who has fever blisters.    This information is not intended to replace advice given to you by your health care   provider. Make sure you discuss any questions you have with your health care provider.   Document Released: 06/01/2000 Document Revised: 03/25/2013 Document Reviewed: 01/05/2013 Elsevier Interactive Patient Education 2016 [employer] Inc[employer]  

## 2015-12-19 NOTE — Progress Notes (Signed)
   Subjective:    Patient ID: Janet White, female    DOB: 1995-12-20, 20 y.o.   MRN: [id number]  HPI PT presents to the office today with recurrent eczema. Pt states she has had eczema since she was 1 years and has "flare ups" at times. PT is currently taking Kenalog 0.1% and Temovate 0.05% BID. Pt states these were working well, but last week she had a breakout on her bilateral hands, arm, legs, and feet. PT states over the weekend it's progressed and become worse. PT describes the rash as erythemas, dry, and itchy. Pt has a dermatologists that she goes to every 4 months, and has appt next month.    Review of Systems  Constitutional: Negative.   HENT: Negative.   Eyes: Negative.   Respiratory: Negative.  Negative for shortness of breath.   Cardiovascular: Negative.  Negative for palpitations.  Gastrointestinal: Negative.   Endocrine: Negative.   Genitourinary: Negative.   Musculoskeletal: Negative.   Skin: Positive for rash.  Neurological: Negative.  Negative for headaches.  Hematological: Negative.   Psychiatric/Behavioral: Negative.   All other systems reviewed and are negative.      Objective:   Physical Exam  Constitutional: She is oriented to person, place, and time. She appears well-developed and well-nourished. No distress.  HENT:  Head: Normocephalic.  Mouth/Throat: Oropharynx is clear and moist.  Cardiovascular: Normal rate, regular rhythm, normal heart sounds and intact distal pulses.   No murmur heard. Pulmonary/Chest: Effort normal and breath sounds normal. No respiratory distress. She has no wheezes.  Abdominal: Soft. Bowel sounds are normal. She exhibits no distension. There is no tenderness.  Musculoskeletal: Normal range of motion. She exhibits no edema or tenderness.  Neurological: She is alert and oriented to person, place, and time. She has normal reflexes. No cranial nerve deficit.  Skin: Skin is warm and dry. Rash noted. There is erythema.  Erythemas rash  on bilateral hands, arms, legs, and feet. Scatter scabbing throughout rash.  Right AC erythemas, swelling, and warmth present  Psychiatric: She has a normal mood and affect. Her behavior is normal. Judgment and thought content normal.  Vitals reviewed.     BP 117/74 mmHg  Pulse 68  Temp(Src) 98.5 F (36.9 C) (Oral)  Ht 5\' 4"  (1.626 m)  Wt 154 lb 12.8 oz (70.217 kg)  BMI 26.56 kg/m2     Assessment & Plan:  1. Eczema - methylPREDNISolone acetate (DEPO-MEDROL) injection 80 mg; Inject 1 mL (80 mg total) into the muscle once.  2. Skin infection, bacterial - amoxicillin (AMOXIL) 875 MG tablet; Take 1 tablet (875 mg total) by mouth 2 (two) times daily.  Dispense: 14 tablet; Refill: 0   Do not scratch Keep clean and dry Avoid hot showers and limit to less than 5 mins Apply thick Moisturizer   Keep Dermatologists appt!! RTO prn   Jannifer Rodneyhristy Tyrin Herbers, FNP

## 2015-12-27 DIAGNOSIS — L2084 Intrinsic (allergic) eczema: Secondary | ICD-10-CM | POA: Diagnosis not present

## 2016-03-05 DIAGNOSIS — L2084 Intrinsic (allergic) eczema: Secondary | ICD-10-CM | POA: Diagnosis not present

## 2016-04-02 DIAGNOSIS — L700 Acne vulgaris: Secondary | ICD-10-CM | POA: Diagnosis not present

## 2016-04-02 DIAGNOSIS — L2084 Intrinsic (allergic) eczema: Secondary | ICD-10-CM | POA: Diagnosis not present

## 2016-04-06 ENCOUNTER — Encounter: Payer: Self-pay | Admitting: Family Medicine

## 2016-04-06 ENCOUNTER — Ambulatory Visit (INDEPENDENT_AMBULATORY_CARE_PROVIDER_SITE_OTHER): Payer: BLUE CROSS/BLUE SHIELD | Admitting: Family Medicine

## 2016-04-06 VITALS — BP 115/78 | HR 92 | Temp 98.3°F | Ht 64.0 in | Wt 159.1 lb

## 2016-04-06 DIAGNOSIS — M545 Low back pain, unspecified: Secondary | ICD-10-CM

## 2016-04-06 MED ORDER — CYCLOBENZAPRINE HCL 10 MG PO TABS: 10.0000 mg | ORAL_TABLET | Freq: Three times a day (TID) | ORAL | 0 refills | Status: DC | PRN

## 2016-04-06 NOTE — Progress Notes (Signed)
BP 115/78   Pulse 92   Temp 98.3 F (36.8 C) (Oral)   Ht 5\' 4"  (1.626 m)   Wt 159 lb 2 oz (72.2 kg)   LMP 03/10/2016 (Approximate)   BMI 27.31 kg/m    Subjective:    Patient ID: Janet White, female    DOB: Nov 07, 1995, 20 y.o.   MRN: [id number]  HPI: Janet White is a 20 y.o. female presenting on 04/06/2016 for Back Pain (having muscle spasms in lower back, no known injury, taking Advil)   HPI Low back pain Patient comes in with bilateral low back pain that started 2 days ago and she feels like the spasms have been increasing. She does not have any known injury that could've brought this on. She denies any major trauma. She denies having this previously. She denies any tingling or numbness going down either of her legs. She denies any dysuria or hematuria or vaginal discharge or vaginal irritation or nausea or vomiting or abdominal complaints.  Relevant past medical, surgical, family and social history reviewed and updated as indicated. Interim medical history since our last visit reviewed. Allergies and medications reviewed and updated.  Review of Systems  Constitutional: Negative for chills and fever.  HENT: Negative for congestion, ear discharge and ear pain.   Eyes: Negative for redness and visual disturbance.  Respiratory: Negative for chest tightness and shortness of breath.   Cardiovascular: Negative for chest pain and leg swelling.  Gastrointestinal: Negative for abdominal pain.  Genitourinary: Negative for difficulty urinating, dysuria, flank pain, hematuria, menstrual problem, pelvic pain, urgency, vaginal bleeding, vaginal discharge and vaginal pain.  Musculoskeletal: Positive for back pain and myalgias. Negative for gait problem, neck pain and neck stiffness.  Skin: Negative for rash.  Neurological: Negative for weakness, light-headedness, numbness and headaches.  Psychiatric/Behavioral: Negative for agitation and behavioral problems.  All other systems reviewed and  are negative.   Per HPI unless specifically indicated above     Objective:    BP 115/78   Pulse 92   Temp 98.3 F (36.8 C) (Oral)   Ht 5\' 4"  (1.626 m)   Wt 159 lb 2 oz (72.2 kg)   LMP 03/10/2016 (Approximate)   BMI 27.31 kg/m   Wt Readings from Last 3 Encounters:  04/06/16 159 lb 2 oz (72.2 kg)  12/19/15 154 lb 12.8 oz (70.2 kg)  07/22/15 155 lb 12.8 oz (70.7 kg)    Physical Exam  Constitutional: She is oriented to person, place, and time. She appears well-developed and well-nourished. No distress.  Eyes: Conjunctivae are normal.  Cardiovascular: Normal rate, regular rhythm, normal heart sounds and intact distal pulses.   No murmur heard. Pulmonary/Chest: Effort normal and breath sounds normal. No respiratory distress. She has no wheezes.  Abdominal: Soft. Bowel sounds are normal. She exhibits no distension. There is no tenderness. There is no rebound and no guarding.  Musculoskeletal: Normal range of motion. She exhibits no edema or tenderness.       Lumbar back: She exhibits normal range of motion, no tenderness (No tenderness on palpation. Tender with flexion and extension of the back), no bony tenderness, no swelling, no deformity and normal pulse.  Neurological: She is alert and oriented to person, place, and time. Coordination normal.  Skin: Skin is warm and dry. No rash noted. She is not diaphoretic.  Psychiatric: She has a normal mood and affect. Her behavior is normal.  Nursing note and vitals reviewed.     Assessment & Plan:  Problem List Items Addressed This Visit    None    Visit Diagnoses    Acute bilateral low back pain without sciatica    -  Primary   Recommended stretches and muscle relaxer and heating pad and TENS unit   Relevant Medications   cyclobenzaprine (FLEXERIL) 10 MG tablet       Follow up plan: Return if symptoms worsen or fail to improve.  Counseling provided for all of the vaccine components No orders of the defined types were placed  in this encounter.   Arville Care, MD [facility] 04/06/2016, 1:53 PM

## 2016-07-13 ENCOUNTER — Encounter: Payer: Self-pay | Admitting: Family

## 2016-07-13 ENCOUNTER — Ambulatory Visit (INDEPENDENT_AMBULATORY_CARE_PROVIDER_SITE_OTHER): Payer: BLUE CROSS/BLUE SHIELD | Admitting: Family

## 2016-07-13 VITALS — BP 122/76 | HR 96 | Temp 97.6°F | Ht 64.0 in | Wt 156.0 lb

## 2016-07-13 DIAGNOSIS — J069 Acute upper respiratory infection, unspecified: Secondary | ICD-10-CM | POA: Diagnosis not present

## 2016-07-13 MED ORDER — ALBUTEROL SULFATE HFA 108 (90 BASE) MCG/ACT IN AERS: 2.0000 | INHALATION_SPRAY | Freq: Four times a day (QID) | RESPIRATORY_TRACT | 2 refills | Status: DC | PRN

## 2016-07-13 MED ORDER — AZITHROMYCIN 250 MG PO TABS: ORAL_TABLET | ORAL | 0 refills | Status: DC

## 2016-07-13 NOTE — Patient Instructions (Signed)
Upper Respiratory Infection, Adult Most upper respiratory infections (URIs) are a viral infection of the air passages leading to the lungs. A URI affects the nose, throat, and upper air passages. The most common type of URI is nasopharyngitis and is typically referred to as "the common cold." URIs run their course and usually go away on their own. Most of the time, a URI does not require medical attention, but sometimes a bacterial infection in the upper airways can follow a viral infection. This is called a secondary infection. Sinus and middle ear infections are common types of secondary upper respiratory infections. Bacterial pneumonia can also complicate a URI. A URI can worsen asthma and chronic obstructive pulmonary disease (COPD). Sometimes, these complications can require emergency medical care and may be life threatening. What are the causes? Almost all URIs are caused by viruses. A virus is a type of germ and can spread from one person to another. What increases the risk? You may be at risk for a URI if:  You smoke.  You have chronic heart or lung disease.  You have a weakened defense (immune) system.  You are very young or very old.  You have nasal allergies or asthma.  You work in crowded or poorly ventilated areas.  You work in health care facilities or schools.  What are the signs or symptoms? Symptoms typically develop 2-3 days after you come in contact with a cold virus. Most viral URIs last 7-10 days. However, viral URIs from the influenza virus (flu virus) can last 14-18 days and are typically more severe. Symptoms may include:  Runny or stuffy (congested) nose.  Sneezing.  Cough.  Sore throat.  Headache.  Fatigue.  Fever.  Loss of appetite.  Pain in your forehead, behind your eyes, and over your cheekbones (sinus pain).  Muscle aches.  How is this diagnosed? Your health care provider may diagnose a URI by:  Physical exam.  Tests to check that your  symptoms are not due to another condition such as: ? Strep throat. ? Sinusitis. ? Pneumonia. ? Asthma.  How is this treated? A URI goes away on its own with time. It cannot be cured with medicines, but medicines may be prescribed or recommended to relieve symptoms. Medicines may help:  Reduce your fever.  Reduce your cough.  Relieve nasal congestion.  Follow these instructions at home:  Take medicines only as directed by your health care provider.  Gargle warm saltwater or take cough drops to comfort your throat as directed by your health care provider.  Use a warm mist humidifier or inhale steam from a shower to increase air moisture. This may make it easier to breathe.  Drink enough fluid to keep your urine clear or pale yellow.  Eat soups and other clear broths and maintain good nutrition.  Rest as needed.  Return to work when your temperature has returned to normal or as your health care provider advises. You may need to stay home longer to avoid infecting others. You can also use a face mask and careful hand washing to prevent spread of the virus.  Increase the usage of your inhaler if you have asthma.  Do not use any tobacco products, including cigarettes, chewing tobacco, or electronic cigarettes. If you need help quitting, ask your health care provider. How is this prevented? The best way to protect yourself from getting a cold is to practice good hygiene.  Avoid oral or hand contact with people with cold symptoms.  Wash your   hands often if contact occurs.  There is no clear evidence that vitamin C, vitamin E, echinacea, or exercise reduces the chance of developing a cold. However, it is always recommended to get plenty of rest, exercise, and practice good nutrition. Contact a health care provider if:  You are getting worse rather than better.  Your symptoms are not controlled by medicine.  You have chills.  You have worsening shortness of breath.  You have  brown or red mucus.  You have yellow or brown nasal discharge.  You have pain in your face, especially when you bend forward.  You have a fever.  You have swollen neck glands.  You have pain while swallowing.  You have white areas in the back of your throat. Get help right away if:  You have severe or persistent: ? Headache. ? Ear pain. ? Sinus pain. ? Chest pain.  You have chronic lung disease and any of the following: ? Wheezing. ? Prolonged cough. ? Coughing up blood. ? A change in your usual mucus.  You have a stiff neck.  You have changes in your: ? Vision. ? Hearing. ? Thinking. ? Mood. This information is not intended to replace advice given to you by your health care provider. Make sure you discuss any questions you have with your health care provider. Document Released: 11/28/2000 Document Revised: 02/05/2016 Document Reviewed: 09/09/2013 Elsevier Interactive Patient Education  2017 [employer] Inc.  

## 2016-07-13 NOTE — Progress Notes (Signed)
   Subjective:    Patient ID: Janet RopesMary White, female    DOB: 1995/09/06, 21 y.o.   MRN: [id number]  Sinus Problem  This is a new problem. The current episode started in the past 7 days. The problem has been gradually worsening since onset. There has been no fever. Her pain is at a severity of 7/10. The pain is mild. Associated symptoms include congestion, coughing, a hoarse voice, sneezing, a sore throat and swollen glands. Pertinent negatives include no ear pain, headaches or sinus pressure. Past treatments include acetaminophen and oral decongestants. The treatment provided mild relief.      Review of Systems  HENT: Positive for congestion, hoarse voice, sneezing and sore throat. Negative for ear pain and sinus pressure.   Respiratory: Positive for cough.   Neurological: Negative for headaches.  All other systems reviewed and are negative.      Objective:   Physical Exam  Constitutional: She is oriented to person, place, and time. She appears well-developed and well-nourished. No distress.  HENT:  Head: Normocephalic and atraumatic.  Right Ear: External ear normal.  Left Ear: External ear normal.  Nose: Mucosal edema and rhinorrhea present.  Mouth/Throat: Posterior oropharyngeal erythema present.  Eyes: Pupils are equal, round, and reactive to light.  Neck: Normal range of motion. Neck supple. No thyromegaly present.  Cardiovascular: Normal rate, regular rhythm, normal heart sounds and intact distal pulses.   No murmur heard. Pulmonary/Chest: Effort normal and breath sounds normal. No respiratory distress. She has no wheezes.  Abdominal: Soft. Bowel sounds are normal. She exhibits no distension. There is no tenderness.  Musculoskeletal: Normal range of motion. She exhibits no edema or tenderness.  Neurological: She is alert and oriented to person, place, and time.  Skin: Skin is warm and dry.  Psychiatric: She has a normal mood and affect. Her behavior is normal. Judgment and  thought content normal.  Vitals reviewed.   BP 122/76   Pulse 96   Temp 97.6 F (36.4 C) (Oral)   Ht 5\' 4"  (1.626 m)   Wt 156 lb (70.8 kg)   BMI 26.78 kg/m      Assessment & Plan:  1. Acute upper respiratory infection - Take meds as prescribed - Use a cool mist humidifier  -Use saline nose sprays frequently -Saline irrigations of the nose can be very helpful if done frequently.  * 4X daily for 1 week*  * Use of a nettie pot can be helpful with this. Follow directions with this* -Force fluids -For any cough or congestion  Use plain Mucinex- regular strength or max strength is fine   * Children- consult with Pharmacist for dosing -For fever or aces or pains- take tylenol or ibuprofen appropriate for age and weight.  * for fevers greater than 101 orally you may alternate ibuprofen and tylenol every  3 hours. -Throat lozenges if help -New toothbrush in 3 days - azithromycin (ZITHROMAX) 250 MG tablet; Take 500 mg once, then 250 mg for four days  Dispense: 6 tablet; Refill: 0   Jannifer Rodneyhristy Hawks, FNP

## 2016-07-23 ENCOUNTER — Ambulatory Visit (INDEPENDENT_AMBULATORY_CARE_PROVIDER_SITE_OTHER): Payer: BLUE CROSS/BLUE SHIELD | Admitting: Nurse Practitioner

## 2016-07-23 ENCOUNTER — Telehealth: Payer: Self-pay | Admitting: Family

## 2016-07-23 ENCOUNTER — Encounter: Payer: Self-pay | Admitting: Nurse Practitioner

## 2016-07-23 VITALS — BP 120/78 | HR 107 | Temp 98.9°F | Ht 64.0 in | Wt 157.0 lb

## 2016-07-23 DIAGNOSIS — J101 Influenza due to other identified influenza virus with other respiratory manifestations: Secondary | ICD-10-CM

## 2016-07-23 DIAGNOSIS — R52 Pain, unspecified: Secondary | ICD-10-CM

## 2016-07-23 LAB — VERITOR FLU A/B WAIVED
Influenza A: POSITIVE — AB
Influenza B: NEGATIVE

## 2016-07-23 MED ORDER — OSELTAMIVIR PHOSPHATE 75 MG PO CAPS: 75.0000 mg | ORAL_CAPSULE | Freq: Two times a day (BID) | ORAL | 0 refills | Status: DC

## 2016-07-23 NOTE — Progress Notes (Signed)
   Subjective:    Patient ID: Janet White, female    DOB: 1996/06/06, 21 y.o.   MRN: [id number]  HPI Patient comes in c/o cough congestion , fever and chills that started all the sudden yesterday. Fever last night was greater then 101.    Review of Systems  Constitutional: Positive for chills and fever.  HENT: Positive for congestion. Negative for trouble swallowing and voice change.   Respiratory: Positive for cough.   Gastrointestinal: Negative.   Genitourinary: Negative.   Musculoskeletal: Positive for arthralgias.  Neurological: Positive for headaches.  Psychiatric/Behavioral: Negative.        Objective:   Physical Exam  Constitutional: She appears well-developed and well-nourished.  HENT:  Right Ear: Hearing, tympanic membrane, external ear and ear canal normal.  Left Ear: Hearing, tympanic membrane, external ear and ear canal normal.  Nose: Mucosal edema and rhinorrhea present. Right sinus exhibits no maxillary sinus tenderness and no frontal sinus tenderness. Left sinus exhibits no maxillary sinus tenderness and no frontal sinus tenderness.  Mouth/Throat: Uvula is midline, oropharynx is clear and moist and mucous membranes are normal.  Cardiovascular: Normal rate and normal heart sounds.   Pulmonary/Chest: Effort normal and breath sounds normal.  Deep dry cough  Abdominal: Soft. Bowel sounds are normal.  Skin: Skin is warm.  Psychiatric: She has a normal mood and affect. Her behavior is normal. Judgment and thought content normal.   BP 120/78   Pulse (!) 107   Temp 98.9 F (37.2 C) (Oral)   Ht 5\' 4"  (1.626 m)   Wt 157 lb (71.2 kg)   BMI 26.95 kg/m         Assessment & Plan:   1. Body aches   2. Influenza A    1. Take meds as prescribed 2. Use a cool mist humidifier especially during the winter months and when heat has been humid. 3. Use saline nose sprays frequently 4. Saline irrigations of the nose can be very helpful if done frequently.  * 4X daily for  1 week*  * Use of a nettie pot can be helpful with this. Follow directions with this* 5. Drink plenty of fluids 6. Keep thermostat turn down low 7.For any cough or congestion  Use plain Mucinex- regular strength or max strength is fine   * Children- consult with Pharmacist for dosing 8. For fever or aces or pains- take tylenol or ibuprofen appropriate for age and weight.  * for fevers greater than 101 orally you may alternate ibuprofen and tylenol every  3 hours.   Meds ordered this encounter  Medications  . oseltamivir (TAMIFLU) 75 MG capsule    Sig: Take 1 capsule (75 mg total) by mouth 2 (two) times daily.    Dispense:  10 capsule    Refill:  0    Order Specific Question:   Supervising Provider    Answer:   Johna SheriffVINCENT, CAROL L [[id number]]   Janet Daphine DeutscherMartin, FNP

## 2016-07-23 NOTE — Telephone Encounter (Signed)
Pt has fever, cough and congestion appt scheduled

## 2016-07-23 NOTE — Patient Instructions (Signed)

## 2016-08-16 ENCOUNTER — Ambulatory Visit (INDEPENDENT_AMBULATORY_CARE_PROVIDER_SITE_OTHER): Payer: BLUE CROSS/BLUE SHIELD | Admitting: Nurse Practitioner

## 2016-08-16 ENCOUNTER — Encounter: Payer: Self-pay | Admitting: Nurse Practitioner

## 2016-08-16 VITALS — BP 108/76 | HR 81 | Temp 96.6°F | Ht 64.0 in | Wt 154.0 lb

## 2016-08-16 DIAGNOSIS — Z3009 Encounter for other general counseling and advice on contraception: Secondary | ICD-10-CM

## 2016-08-16 MED ORDER — ETONOGESTREL-ETHINYL ESTRADIOL 0.12-0.015 MG/24HR VA RING: VAGINAL_RING | VAGINAL | 3 refills | Status: DC

## 2016-08-16 NOTE — Patient Instructions (Signed)
Ethinyl Estradiol; Etonogestrel vaginal ring What is this medicine? ETHINYL ESTRADIOL; ETONOGESTREL (ETH in il es tra DYE ole; et oh noe JES trel) vaginal ring is a flexible, vaginal ring used as a contraceptive (birth control method). This medicine combines two types of female hormones, an estrogen and a progestin. This ring is used to prevent ovulation and pregnancy. Each ring is effective for one month. This medicine may be used for other purposes; ask your health care provider or pharmacist if you have questions. COMMON BRAND NAME(S): NuvaRing What should I tell my health care provider before I take this medicine? They need to know if you have or ever had any of these conditions: -abnormal vaginal bleeding -blood vessel disease or blood clots -breast, cervical, endometrial, ovarian, liver, or uterine cancer -diabetes -gallbladder disease -heart disease or recent heart attack -high blood pressure -high cholesterol -kidney disease -liver disease -migraine headaches -stroke -systemic lupus erythematosus (SLE) -tobacco smoker -an unusual or allergic reaction to estrogens, progestins, other medicines, foods, dyes, or preservatives -pregnant or trying to get pregnant -breast-feeding How should I use this medicine? Insert the ring into your vagina as directed. Follow the directions on the prescription label. The ring will remain place for 3 weeks and is then removed for a 1-week break. A new ring is inserted 1 week after the last ring was removed, on the same day of the week. Check often to make sure the ring is still in place, especially before and after sexual intercourse. If the ring was out of the vagina for an unknown amount of time, you may not be protected from pregnancy. Perform a pregnancy test and call your doctor. Do not use more often than directed. A patient package insert for the product will be given with each prescription and refill. Read this sheet carefully each time. The  sheet may change frequently. Contact your pediatrician regarding the use of this medicine in children. Special care may be needed. This medicine has been used in female children who have started having menstrual periods. Overdosage: If you think you have taken too much of this medicine contact a poison control center or emergency room at once. NOTE: This medicine is only for you. Do not share this medicine with others. What if I miss a dose? You will need to replace your vaginal ring once a month as directed. If the ring should slip out, or if you leave it in longer or shorter than you should, contact your health care professional for advice. What may interact with this medicine? Do not take this medicine with the following medication: -dasabuvir; ombitasvir; paritaprevir; ritonavir -ombitasvir; paritaprevir; ritonavir This medicine may also interact with the following medications: -acetaminophen -antibiotics or medicines for infections, especially rifampin, rifabutin, rifapentine, and griseofulvin, and possibly penicillins or tetracyclines -aprepitant -ascorbic acid (vitamin C) -atorvastatin -barbiturate medicines, such as phenobarbital -bosentan -carbamazepine -caffeine -clofibrate -cyclosporine -dantrolene -doxercalciferol -felbamate -grapefruit juice -hydrocortisone -medicines for anxiety or sleeping problems, such as diazepam or temazepam -medicines for diabetes, including pioglitazone -modafinil -mycophenolate -nefazodone -oxcarbazepine -phenytoin -prednisolone -ritonavir or other medicines for HIV infection or AIDS -rosuvastatin -selegiline -soy isoflavones supplements -St. John's wort -tamoxifen or raloxifene -theophylline -thyroid hormones -topiramate -warfarin This list may not describe all possible interactions. Give your health care provider a list of all the medicines, herbs, non-prescription drugs, or dietary supplements you use. Also tell them if you smoke,  drink alcohol, or use illegal drugs. Some items may interact with your medicine. What should I watch for while using   this medicine? Visit your doctor or health care professional for regular checks on your progress. You will need a regular breast and pelvic exam and Pap smear while on this medicine. Use an additional method of contraception during the first cycle that you use this ring. Do not use a diaphragm or female condom, as the ring can interfere with these birth control methods and their proper placement. If you have any reason to think you are pregnant, stop using this medicine right away and contact your doctor or health care professional. If you are using this medicine for hormone related problems, it may take several cycles of use to see improvement in your condition. Smoking increases the risk of getting a blood clot or having a stroke while you are using hormonal birth control, especially if you are more than 22 years old. You are strongly advised not to smoke. This medicine can make your body retain fluid, making your fingers, hands, or ankles swell. Your blood pressure can go up. Contact your doctor or health care professional if you feel you are retaining fluid. This medicine can make you more sensitive to the sun. Keep out of the sun. If you cannot avoid being in the sun, wear protective clothing and use sunscreen. Do not use sun lamps or tanning beds/booths. If you wear contact lenses and notice visual changes, or if the lenses begin to feel uncomfortable, consult your eye care specialist. In some women, tenderness, swelling, or minor bleeding of the gums may occur. Notify your dentist if this happens. Brushing and flossing your teeth regularly may help limit this. See your dentist regularly and inform your dentist of the medicines you are taking. If you are going to have elective surgery, you may need to stop using this medicine before the surgery. Consult your health care professional  for advice. This medicine does not protect you against HIV infection (AIDS) or any other sexually transmitted diseases. What side effects may I notice from receiving this medicine? Side effects that you should report to your doctor or health care professional as soon as possible: -breast tissue changes or discharge -changes in vaginal bleeding during your period or between your periods -chest pain -coughing up blood -dizziness or fainting spells -headaches or migraines -leg, arm or groin pain -severe or sudden headaches -stomach pain (severe) -sudden shortness of breath -sudden loss of coordination, especially on one side of the body -speech problems -symptoms of vaginal infection like itching, irritation or unusual discharge -tenderness in the upper abdomen -vomiting -weakness or numbness in the arms or legs, especially on one side of the body -yellowing of the eyes or skin Side effects that usually do not require medical attention (report to your doctor or health care professional if they continue or are bothersome): -breakthrough bleeding and spotting that continues beyond the 3 initial cycles of pills -breast tenderness -mood changes, anxiety, depression, frustration, anger, or emotional outbursts -increased sensitivity to sun or ultraviolet light -nausea -skin rash, acne, or brown spots on the skin -weight gain (slight) This list may not describe all possible side effects. Call your doctor for medical advice about side effects. You may report side effects to FDA at 1-800-FDA-1088. Where should I keep my medicine? Keep out of the reach of children. Store at room temperature between 15 and 30 degrees C (59 and 86 degrees F) for up to 4 months. The product will expire after 4 months. Protect from light. Throw away any unused medicine after the expiration date. NOTE: This   sheet is a summary. It may not cover all possible information. If you have questions about this medicine, talk  to your doctor, pharmacist, or health care provider.  2018 Elsevier/Gold Standard (2016-02-10 17:00:31)  

## 2016-08-16 NOTE — Progress Notes (Signed)
   Subjective:    Patient ID: Janet White, female    DOB: [birth year], 21 y.o.   MRN: [id number]  HPI  Patient I sin today to discuss birth control. She use to be on loestrin Fe- she has not taken in over a year. Says that she did not have any problems with it but she just could not remember to take medication. LMP- 08/13/16- normal.Sexually active- usually uses condoms.   Review of Systems  Constitutional: Negative.   HENT: Negative.   Respiratory: Negative.   Cardiovascular: Negative.   Genitourinary: Negative.   Neurological: Negative.   Psychiatric/Behavioral: Negative.   All other systems reviewed and are negative.      Objective:   Physical Exam  Constitutional: She is oriented to person, place, and time. She appears well-developed and well-nourished. No distress.  Cardiovascular: Normal rate and regular rhythm.   Pulmonary/Chest: Effort normal and breath sounds normal.  Neurological: She is alert and oriented to person, place, and time.  Skin: Skin is warm.  Psychiatric: She has a normal mood and affect. Her behavior is normal. Judgment and thought content normal.   BP 108/76   Pulse 81   Temp (!) 96.6 F (35.9 C) (Oral)   Ht 5\' 4"  (1.626 m)   Wt 154 lb (69.9 kg)   BMI 26.43 kg/m        Assessment & Plan:  1. Birth control counseling Safe sex discussed RTO prn - etonogestrel-ethinyl estradiol (NUVARING) 0.12-0.015 MG/24HR vaginal ring; Insert vaginally and leave in place for 3 consecutive weeks, then remove for 1 week.  Dispense: 3 each; Refill: 3  Daisha-Margaret Daphine DeutscherMartin, FNP

## 2016-08-20 ENCOUNTER — Other Ambulatory Visit: Payer: Self-pay

## 2016-08-20 DIAGNOSIS — L309 Dermatitis, unspecified: Secondary | ICD-10-CM

## 2016-08-20 MED ORDER — CLOBETASOL PROPIONATE 0.05 % EX OINT: 1.0000 "application " | TOPICAL_OINTMENT | Freq: Two times a day (BID) | CUTANEOUS | 2 refills | Status: DC

## 2016-09-03 ENCOUNTER — Telehealth: Payer: Self-pay

## 2016-09-03 NOTE — Telephone Encounter (Signed)
Cal patient and see if she has seen derm recently

## 2016-09-10 ENCOUNTER — Ambulatory Visit (INDEPENDENT_AMBULATORY_CARE_PROVIDER_SITE_OTHER): Payer: BLUE CROSS/BLUE SHIELD | Admitting: Nurse Practitioner

## 2016-09-10 ENCOUNTER — Encounter: Payer: Self-pay | Admitting: Nurse Practitioner

## 2016-09-10 VITALS — BP 120/71 | HR 97 | Temp 96.8°F | Ht 64.0 in | Wt 158.0 lb

## 2016-09-10 DIAGNOSIS — M545 Low back pain, unspecified: Secondary | ICD-10-CM

## 2016-09-10 DIAGNOSIS — T148XXA Other injury of unspecified body region, initial encounter: Secondary | ICD-10-CM

## 2016-09-10 LAB — MICROSCOPIC EXAMINATION: Renal Epithel, UA: NONE SEEN /hpf

## 2016-09-10 LAB — URINALYSIS, COMPLETE
Bilirubin, UA: NEGATIVE
GLUCOSE, UA: NEGATIVE
Ketones, UA: NEGATIVE
LEUKOCYTES UA: NEGATIVE
Nitrite, UA: NEGATIVE
PROTEIN UA: NEGATIVE
RBC, UA: NEGATIVE
Specific Gravity, UA: <1.005 — ABNORMAL LOW (ref 1.005–1.030)
UUROB: 0.2 mg/dL (ref 0.2–1.0)
pH, UA: 6 (ref 5.0–7.5)

## 2016-09-10 MED ORDER — MELOXICAM 15 MG PO TABS: 15.0000 mg | ORAL_TABLET | Freq: Every day | ORAL | 0 refills | Status: DC

## 2016-09-10 MED ORDER — KETOROLAC TROMETHAMINE 60 MG/2ML IM SOLN
60.0000 mg | Freq: Once | INTRAMUSCULAR | Status: AC
  Administered 2016-09-10: 60 mg via INTRAMUSCULAR

## 2016-09-10 NOTE — Patient Instructions (Signed)
Low Back Sprain Rehab  Ask your health care provider which exercises are safe for you. Do exercises exactly as told by your health care provider and adjust them as directed. It is normal to feel mild stretching, pulling, tightness, or discomfort as you do these exercises, but you should stop right away if you feel sudden pain or your pain gets worse. Do not begin these exercises until told by your health care provider.  Stretching and range of motion exercises  These exercises warm up your muscles and joints and improve the movement and flexibility of your back. These exercises also help to relieve pain, numbness, and tingling.  Exercise A: Lumbar rotation     1. Lie on your back on a firm surface and bend your knees.  2. Straighten your arms out to your sides so each arm forms an "L" shape with a side of your body (a 90 degree angle).  3. Slowly move both of your knees to one side of your body until you feel a stretch in your lower back. Try not to let your shoulders move off of the floor.  4. Hold for __________ seconds.  5. Tense your abdominal muscles and slowly move your knees back to the starting position.  6. Repeat this exercise on the other side of your body.  Repeat __________ times. Complete this exercise __________ times a day.  Exercise B: Prone extension on elbows     1. Lie on your abdomen on a firm surface.  2. Prop yourself up on your elbows.  3. Use your arms to help lift your chest up until you feel a gentle stretch in your abdomen and your lower back.  ? This will place some of your body weight on your elbows. If this is uncomfortable, try stacking pillows under your chest.  ? Your hips should stay down, against the surface that you are lying on. Keep your hip and back muscles relaxed.  4. Hold for __________ seconds.  5. Slowly relax your upper body and return to the starting position.  Repeat __________ times. Complete this exercise __________ times a day.  Strengthening exercises  These  exercises build strength and endurance in your back. Endurance is the ability to use your muscles for a long time, even after they get tired.  Exercise C: Pelvic tilt   1. Lie on your back on a firm surface. Bend your knees and keep your feet flat.  2. Tense your abdominal muscles. Tip your pelvis up toward the ceiling and flatten your lower back into the floor.  ? To help with this exercise, you may place a small towel under your lower back and try to push your back into the towel.  3. Hold for __________ seconds.  4. Let your muscles relax completely before you repeat this exercise.  Repeat __________ times. Complete this exercise __________ times a day.  Exercise D: Alternating arm and leg raises     1. Get on your hands and knees on a firm surface. If you are on a hard floor, you may want to use padding to cushion your knees, such as an exercise mat.  2. Line up your arms and legs. Your hands should be below your shoulders, and your knees should be below your hips.  3. Lift your left leg behind you. At the same time, raise your right arm and straighten it in front of you.  ? Do not lift your leg higher than your hip.  ? Do   not lift your arm higher than your shoulder.  ? Keep your abdominal and back muscles tight.  ? Keep your hips facing the ground.  ? Do not arch your back.  ? Keep your balance carefully, and do not hold your breath.  4. Hold for __________ seconds.  5. Slowly return to the starting position and repeat with your right leg and your left arm.  Repeat __________ times. Complete this exercise __________ times a day.  Exercise E: Abdominal set with straight leg raise     1. Lie on your back on a firm surface.  2. Bend one of your knees and keep your other leg straight.  3. Tense your abdominal muscles and lift your straight leg up, 4-6 inches (10-15 cm) off the ground.  4. Keep your abdominal muscles tight and hold for __________ seconds.  ? Do not hold your breath.  ? Do not arch your back. Keep it  flat against the ground.  5. Keep your abdominal muscles tense as you slowly lower your leg back to the starting position.  6. Repeat with your other leg.  Repeat __________ times. Complete this exercise __________ times a day.  Posture and body mechanics     Body mechanics refers to the movements and positions of your body while you do your daily activities. Posture is part of body mechanics. Good posture and healthy body mechanics can help to relieve stress in your body's tissues and joints. Good posture means that your spine is in its natural S-curve position (your spine is neutral), your shoulders are pulled back slightly, and your head is not tipped forward. The following are general guidelines for applying improved posture and body mechanics to your everyday activities.  Standing     · When standing, keep your spine neutral and your feet about hip-width apart. Keep a slight bend in your knees. Your ears, shoulders, and hips should line up.  · When you do a task in which you stand in one place for a long time, place one foot up on a stable object that is 2-4 inches (5-10 cm) high, such as a footstool. This helps keep your spine neutral.  Sitting     · When sitting, keep your spine neutral and keep your feet flat on the floor. Use a footrest, if necessary, and keep your thighs parallel to the floor. Avoid rounding your shoulders, and avoid tilting your head forward.  · When working at a desk or a computer, keep your desk at a height where your hands are slightly lower than your elbows. Slide your chair under your desk so you are close enough to maintain good posture.  · When working at a computer, place your monitor at a height where you are looking straight ahead and you do not have to tilt your head forward or downward to look at the screen.  Resting     · When lying down and resting, avoid positions that are most painful for you.  · If you have pain with activities such as sitting, bending, stooping, or  squatting (flexion-based activities), lie in a position in which your body does not bend very much. For example, avoid curling up on your side with your arms and knees near your chest (fetal position).  · If you have pain with activities such as standing for a long time or reaching with your arms (extension-based activities), lie with your spine in a neutral position and bend your knees slightly. Try the following   positions:  · Lying on your side with a pillow between your knees.  · Lying on your back with a pillow under your knees.  Lifting     · When lifting objects, keep your feet at least shoulder-width apart and tighten your abdominal muscles.  · Bend your knees and hips and keep your spine neutral. It is important to lift using the strength of your legs, not your back. Do not lock your knees straight out.  · Always ask for help to lift heavy or awkward objects.  This information is not intended to replace advice given to you by your health care provider. Make sure you discuss any questions you have with your health care provider.  Document Released: 06/04/2005 Document Revised: 02/09/2016 Document Reviewed: 03/16/2015  Elsevier Interactive Patient Education © 2017 [employer] Inc[employer]

## 2016-09-10 NOTE — Progress Notes (Signed)
   Subjective:    Patient ID: Charleston RopesMary Shutter, female    DOB: 05/19/1996, 21 y.o.   MRN: [id number]  HPI: Pt presents with low back pain x 1 week. No trauma, no dysuria. Pt explains the pain is a 7/10, constant, and is sharp. Patient states she normally gets "a pain like this" before menstruation but this time the pain is out of context.   Review of Systems  Constitutional: Negative.   Respiratory: Negative.   Cardiovascular: Negative.   Gastrointestinal: Negative.   Genitourinary: Negative.  Negative for difficulty urinating, dysuria and flank pain.  Musculoskeletal: Positive for back pain.  Psychiatric/Behavioral: Negative.        Objective:   Physical Exam  Constitutional: She is oriented to person, place, and time. She appears well-developed and well-nourished.  Cardiovascular: Normal rate and regular rhythm.   Pulmonary/Chest: Effort normal and breath sounds normal.  Abdominal: Soft. Bowel sounds are normal.  Musculoskeletal: She exhibits no tenderness.  No CVA tenderness or lumbar tenderness to palpation  Neurological: She is alert and oriented to person, place, and time.  Skin: Skin is warm and dry.  Psychiatric: She has a normal mood and affect. Her behavior is normal. Judgment and thought content normal.   BP 120/71   Pulse 97   Temp (!) 96.8 F (36 C) (Oral)   Ht 5\' 4"  (1.626 m)   Wt 158 lb (71.7 kg)   LMP 09/10/2016   BMI 27.12 kg/m      Assessment & Plan:  1. Acute bilateral low back pain without sciatica - Urinalysis, Complete  2. Muscle strain Rest the muscle. Take medication as prescribed. Use proper ergonomics when lifting.  - meloxicam (MOBIC) 15 MG tablet; Take 1 tablet (15 mg total) by mouth daily.  Dispense: 30 tablet; Refill: 0 - ketorolac (TORADOL) injection 60 mg; Inject 2 mLs (60 mg total) into the muscle once.  Elder LoveMorgan Mays, FNP student Alesi-Margaret Daphine DeutscherMartin, FNP

## 2016-11-30 DIAGNOSIS — Z113 Encounter for screening for infections with a predominantly sexual mode of transmission: Secondary | ICD-10-CM | POA: Diagnosis not present

## 2017-01-21 ENCOUNTER — Encounter: Payer: Self-pay | Admitting: Family

## 2017-01-21 ENCOUNTER — Telehealth: Payer: Self-pay | Admitting: Family

## 2017-01-21 ENCOUNTER — Telehealth: Payer: Self-pay

## 2017-01-21 ENCOUNTER — Ambulatory Visit (INDEPENDENT_AMBULATORY_CARE_PROVIDER_SITE_OTHER): Payer: BLUE CROSS/BLUE SHIELD | Admitting: Family

## 2017-01-21 VITALS — BP 114/78 | HR 71 | Temp 99.4°F | Ht 64.0 in | Wt 150.0 lb

## 2017-01-21 DIAGNOSIS — B958 Unspecified staphylococcus as the cause of diseases classified elsewhere: Secondary | ICD-10-CM | POA: Diagnosis not present

## 2017-01-21 DIAGNOSIS — L089 Local infection of the skin and subcutaneous tissue, unspecified: Secondary | ICD-10-CM

## 2017-01-21 DIAGNOSIS — L309 Dermatitis, unspecified: Secondary | ICD-10-CM | POA: Diagnosis not present

## 2017-01-21 DIAGNOSIS — J301 Allergic rhinitis due to pollen: Secondary | ICD-10-CM

## 2017-01-21 MED ORDER — CRISABOROLE 2 % EX OINT: 1.0000 "application " | TOPICAL_OINTMENT | Freq: Two times a day (BID) | CUTANEOUS | 2 refills | Status: DC

## 2017-01-21 MED ORDER — CEPHALEXIN 500 MG PO CAPS: 500.0000 mg | ORAL_CAPSULE | Freq: Two times a day (BID) | ORAL | 0 refills | Status: DC

## 2017-01-21 MED ORDER — FLUTICASONE PROPIONATE 50 MCG/ACT NA SUSP: 2.0000 | Freq: Every day | NASAL | 6 refills | Status: DC

## 2017-01-21 NOTE — Telephone Encounter (Signed)
Eucrisa ointment not covered by [employer]

## 2017-01-21 NOTE — Telephone Encounter (Signed)
Please advise 

## 2017-01-21 NOTE — Patient Instructions (Signed)

## 2017-01-21 NOTE — Telephone Encounter (Signed)
Pt was given samples of Eucrisa. She can use this on her face and continue to use Kenalog on her body. Pt needs to follow up with her Dermatologists.

## 2017-01-21 NOTE — Telephone Encounter (Signed)
Please review and advise.

## 2017-01-21 NOTE — Telephone Encounter (Signed)
Mother aware

## 2017-01-21 NOTE — Progress Notes (Signed)
   Subjective:    Patient ID: Janet White, female    DOB: [birth year], 21 y.o.   MRN: [id number]    HPI Pt presents to the office today with right eye swelling, sneezing, rhinorrhea. Denies any ear pain, sinus tenderness, or sore throat.   Pt is followed by her dermatologists every 4 months for eczema. She is currently getting Dupixent every 15 days, but states she has not had hers in over a month. Pt states she has been on vacation and has not ordered it yet.  States she has dry skin, itching skin on bilateral hands, bilateral ankles, and thighs.   Review of Systems  HENT: Positive for congestion, facial swelling, postnasal drip, rhinorrhea and sneezing. Negative for ear discharge, ear pain, sinus pain, sinus pressure, sore throat and tinnitus.   Eyes: Positive for discharge. Negative for pain, redness and itching.       Objective:   Physical Exam  Constitutional: She is oriented to person, place, and time. She appears well-developed and well-nourished. No distress.  HENT:  Head: Normocephalic and atraumatic.  Right Ear: External ear normal.  Left Ear: External ear normal.  Nose: Mucosal edema present.  Mouth/Throat: Oropharynx is clear and moist.  Eyes: Pupils are equal, round, and reactive to light. Right eye exhibits no chemosis, no discharge, no exudate and no hordeolum. No foreign body present in the right eye. Right conjunctiva has no hemorrhage.    Neck: Normal range of motion. Neck supple. No thyromegaly present.  Cardiovascular: Normal rate, regular rhythm, normal heart sounds and intact distal pulses.   No murmur heard. Pulmonary/Chest: Effort normal and breath sounds normal. No respiratory distress. She has no wheezes.  Abdominal: Soft. Bowel sounds are normal. She exhibits no distension. There is no tenderness.  Musculoskeletal: Normal range of motion. She exhibits edema (swelling present under right eye, no discharge present). She exhibits no tenderness.  Neurological:  She is alert and oriented to person, place, and time.  Skin: Skin is warm and dry. Rash noted. There is erythema.  Bilateral palms of hand erythemas, dry, cracked with yellow crusted discharge present  Bilateral ankle erythemas, cracked, and dry  Psychiatric: She has a normal mood and affect. Her behavior is normal. Judgment and thought content normal.  Vitals reviewed.   BP 114/78   Pulse 71   Temp 99.4 F (37.4 C) (Oral)   Ht 5\' 4"  (1.626 m)   Wt 150 lb (68 kg)   BMI 25.75 kg/m      Assessment & Plan:  1. Eczema, unspecified type - Crisaborole (EUCRISA) 2 % OINT; Apply 1 application topically 2 (two) times daily.  Dispense: 60 g; Refill: 2  2. Staph skin infection - cephALEXin (KEFLEX) 500 MG capsule; Take 1 capsule (500 mg total) by mouth 2 (two) times daily.  Dispense: 14 capsule; Refill: 0  3. Allergic rhinitis due to pollen, unspecified seasonality - fluticasone (FLONASE) 50 MCG/ACT nasal spray; Place 2 sprays into both nostrils daily.  Dispense: 16 g; Refill: 6  Do not scratch Follow up with Derm and get back on Dupixent Pt has seen allergen specialists in the past, will hold off on referral at this time Limit allergens, hot showers, and apply thick moisturizer  Jannifer Rodneyhristy Hawks, FNP    Jannifer Rodneyhristy Hawks, FNP

## 2017-01-22 ENCOUNTER — Telehealth: Payer: Self-pay | Admitting: *Deleted

## 2017-01-22 ENCOUNTER — Ambulatory Visit (INDEPENDENT_AMBULATORY_CARE_PROVIDER_SITE_OTHER): Payer: BLUE CROSS/BLUE SHIELD | Admitting: *Deleted

## 2017-01-22 DIAGNOSIS — R220 Localized swelling, mass and lump, head: Secondary | ICD-10-CM

## 2017-01-22 MED ORDER — METHYLPREDNISOLONE ACETATE 80 MG/ML IJ SUSP
80.0000 mg | Freq: Once | INTRAMUSCULAR | Status: AC
  Administered 2017-01-22: 80 mg via INTRAMUSCULAR

## 2017-01-22 NOTE — Telephone Encounter (Signed)
incoming call from pt Requesting steroid shot Please advise

## 2017-01-22 NOTE — Progress Notes (Signed)
Pt given Methylprednisolone inj Pt tolerated well

## 2017-01-22 NOTE — Telephone Encounter (Signed)
Yes I know- does  It work well for her if so see if debbie can get prior auth o=for it. Patient has severe excema, especially on face and not anything else can use on face

## 2017-01-22 NOTE — Addendum Note (Signed)
Addended by: Margurite AuerbachOMPTON, KARLA G on: 01/22/2017 04:00 PM   Modules accepted: Orders

## 2017-01-22 NOTE — Telephone Encounter (Signed)
If pt continues to have eye swelling, pt can come and have steroid shot to see if this will help swelling and eczema

## 2017-01-22 NOTE — Telephone Encounter (Signed)
Pt is coming in at 6:30 tonight to get Depo Medrol shot but for some reason I can't order it. Can you please order? Joyce GrossKay is the night nurse and knows about the situation.

## 2017-02-06 DIAGNOSIS — L2084 Intrinsic (allergic) eczema: Secondary | ICD-10-CM | POA: Diagnosis not present

## 2017-02-06 DIAGNOSIS — B360 Pityriasis versicolor: Secondary | ICD-10-CM | POA: Diagnosis not present

## 2017-02-06 DIAGNOSIS — Z8614 Personal history of Methicillin resistant Staphylococcus aureus infection: Secondary | ICD-10-CM | POA: Diagnosis not present

## 2017-02-06 DIAGNOSIS — Z79899 Other long term (current) drug therapy: Secondary | ICD-10-CM | POA: Diagnosis not present

## 2017-04-22 ENCOUNTER — Ambulatory Visit: Payer: BLUE CROSS/BLUE SHIELD | Admitting: Family Medicine

## 2017-04-22 DIAGNOSIS — R194 Change in bowel habit: Secondary | ICD-10-CM | POA: Diagnosis not present

## 2017-04-22 DIAGNOSIS — G43A0 Cyclical vomiting, not intractable: Secondary | ICD-10-CM | POA: Diagnosis not present

## 2017-05-29 DIAGNOSIS — M9902 Segmental and somatic dysfunction of thoracic region: Secondary | ICD-10-CM | POA: Diagnosis not present

## 2017-05-29 DIAGNOSIS — M62830 Muscle spasm of back: Secondary | ICD-10-CM | POA: Diagnosis not present

## 2017-05-29 DIAGNOSIS — M5441 Lumbago with sciatica, right side: Secondary | ICD-10-CM | POA: Diagnosis not present

## 2017-05-29 DIAGNOSIS — G44209 Tension-type headache, unspecified, not intractable: Secondary | ICD-10-CM | POA: Diagnosis not present

## 2017-05-30 DIAGNOSIS — M9902 Segmental and somatic dysfunction of thoracic region: Secondary | ICD-10-CM | POA: Diagnosis not present

## 2017-05-30 DIAGNOSIS — M5441 Lumbago with sciatica, right side: Secondary | ICD-10-CM | POA: Diagnosis not present

## 2017-05-30 DIAGNOSIS — M62830 Muscle spasm of back: Secondary | ICD-10-CM | POA: Diagnosis not present

## 2017-05-30 DIAGNOSIS — G44209 Tension-type headache, unspecified, not intractable: Secondary | ICD-10-CM | POA: Diagnosis not present

## 2017-05-31 DIAGNOSIS — M5441 Lumbago with sciatica, right side: Secondary | ICD-10-CM | POA: Diagnosis not present

## 2017-05-31 DIAGNOSIS — G44209 Tension-type headache, unspecified, not intractable: Secondary | ICD-10-CM | POA: Diagnosis not present

## 2017-05-31 DIAGNOSIS — M9902 Segmental and somatic dysfunction of thoracic region: Secondary | ICD-10-CM | POA: Diagnosis not present

## 2017-05-31 DIAGNOSIS — M62830 Muscle spasm of back: Secondary | ICD-10-CM | POA: Diagnosis not present

## 2017-06-03 DIAGNOSIS — M9902 Segmental and somatic dysfunction of thoracic region: Secondary | ICD-10-CM | POA: Diagnosis not present

## 2017-06-03 DIAGNOSIS — M5441 Lumbago with sciatica, right side: Secondary | ICD-10-CM | POA: Diagnosis not present

## 2017-06-03 DIAGNOSIS — G44209 Tension-type headache, unspecified, not intractable: Secondary | ICD-10-CM | POA: Diagnosis not present

## 2017-06-03 DIAGNOSIS — M62830 Muscle spasm of back: Secondary | ICD-10-CM | POA: Diagnosis not present

## 2017-06-05 DIAGNOSIS — M9902 Segmental and somatic dysfunction of thoracic region: Secondary | ICD-10-CM | POA: Diagnosis not present

## 2017-06-05 DIAGNOSIS — M5441 Lumbago with sciatica, right side: Secondary | ICD-10-CM | POA: Diagnosis not present

## 2017-06-05 DIAGNOSIS — G44209 Tension-type headache, unspecified, not intractable: Secondary | ICD-10-CM | POA: Diagnosis not present

## 2017-06-05 DIAGNOSIS — M62830 Muscle spasm of back: Secondary | ICD-10-CM | POA: Diagnosis not present

## 2017-06-12 DIAGNOSIS — M545 Low back pain: Secondary | ICD-10-CM | POA: Diagnosis not present

## 2017-06-12 DIAGNOSIS — M25551 Pain in right hip: Secondary | ICD-10-CM | POA: Diagnosis not present

## 2017-06-14 DIAGNOSIS — M545 Low back pain: Secondary | ICD-10-CM | POA: Diagnosis not present

## 2017-06-26 DIAGNOSIS — M545 Low back pain: Secondary | ICD-10-CM | POA: Diagnosis not present

## 2017-06-26 DIAGNOSIS — M5126 Other intervertebral disc displacement, lumbar region: Secondary | ICD-10-CM | POA: Diagnosis not present

## 2017-06-26 DIAGNOSIS — M5416 Radiculopathy, lumbar region: Secondary | ICD-10-CM | POA: Diagnosis not present

## 2017-07-01 DIAGNOSIS — M5126 Other intervertebral disc displacement, lumbar region: Secondary | ICD-10-CM | POA: Diagnosis not present

## 2017-07-01 DIAGNOSIS — M5416 Radiculopathy, lumbar region: Secondary | ICD-10-CM | POA: Diagnosis not present

## 2017-07-22 DIAGNOSIS — M545 Low back pain: Secondary | ICD-10-CM | POA: Diagnosis not present

## 2017-07-22 DIAGNOSIS — M5416 Radiculopathy, lumbar region: Secondary | ICD-10-CM | POA: Diagnosis not present

## 2017-07-22 DIAGNOSIS — M5126 Other intervertebral disc displacement, lumbar region: Secondary | ICD-10-CM | POA: Diagnosis not present

## 2017-08-05 DIAGNOSIS — M5126 Other intervertebral disc displacement, lumbar region: Secondary | ICD-10-CM | POA: Diagnosis not present

## 2017-08-05 DIAGNOSIS — M5416 Radiculopathy, lumbar region: Secondary | ICD-10-CM | POA: Diagnosis not present

## 2017-08-05 DIAGNOSIS — M545 Low back pain: Secondary | ICD-10-CM | POA: Diagnosis not present

## 2017-08-09 DIAGNOSIS — M545 Low back pain: Secondary | ICD-10-CM | POA: Diagnosis not present

## 2017-08-09 DIAGNOSIS — M5126 Other intervertebral disc displacement, lumbar region: Secondary | ICD-10-CM | POA: Diagnosis not present

## 2017-08-09 DIAGNOSIS — M5416 Radiculopathy, lumbar region: Secondary | ICD-10-CM | POA: Diagnosis not present

## 2017-08-10 ENCOUNTER — Encounter: Payer: Self-pay | Admitting: Infectious Diseases

## 2017-08-21 DIAGNOSIS — L2084 Intrinsic (allergic) eczema: Secondary | ICD-10-CM | POA: Diagnosis not present

## 2017-10-02 DIAGNOSIS — M5127 Other intervertebral disc displacement, lumbosacral region: Secondary | ICD-10-CM | POA: Diagnosis not present

## 2017-10-02 DIAGNOSIS — M5416 Radiculopathy, lumbar region: Secondary | ICD-10-CM | POA: Diagnosis not present

## 2017-10-02 DIAGNOSIS — M545 Low back pain: Secondary | ICD-10-CM | POA: Diagnosis not present

## 2017-10-08 DIAGNOSIS — M5136 Other intervertebral disc degeneration, lumbar region: Secondary | ICD-10-CM | POA: Diagnosis not present

## 2017-10-08 DIAGNOSIS — M5117 Intervertebral disc disorders with radiculopathy, lumbosacral region: Secondary | ICD-10-CM | POA: Diagnosis not present

## 2017-10-08 DIAGNOSIS — M5127 Other intervertebral disc displacement, lumbosacral region: Secondary | ICD-10-CM | POA: Diagnosis not present

## 2017-10-08 DIAGNOSIS — M4726 Other spondylosis with radiculopathy, lumbar region: Secondary | ICD-10-CM | POA: Diagnosis not present

## 2017-10-08 DIAGNOSIS — M5116 Intervertebral disc disorders with radiculopathy, lumbar region: Secondary | ICD-10-CM | POA: Diagnosis not present

## 2017-10-11 ENCOUNTER — Other Ambulatory Visit: Payer: Self-pay | Admitting: Nurse Practitioner

## 2017-10-11 DIAGNOSIS — Z3009 Encounter for other general counseling and advice on contraception: Secondary | ICD-10-CM

## 2017-10-11 NOTE — Telephone Encounter (Signed)
Last seen 01/21/17  MMM 

## 2017-10-30 ENCOUNTER — Encounter (HOSPITAL_COMMUNITY): Payer: Self-pay | Admitting: *Deleted

## 2017-10-30 ENCOUNTER — Other Ambulatory Visit: Payer: Self-pay

## 2017-10-30 ENCOUNTER — Emergency Department (HOSPITAL_COMMUNITY)
Admission: EM | Admit: 2017-10-30 | Discharge: 2017-10-31 | Disposition: A | Discharge status: Home / Self Care | Payer: BLUE CROSS/BLUE SHIELD | Attending: Emergency Medicine | Admitting: Emergency Medicine

## 2017-10-30 DIAGNOSIS — L24A9 Irritant contact dermatitis due friction or contact with other specified body fluids: Secondary | ICD-10-CM

## 2017-10-30 DIAGNOSIS — Y69 Unspecified misadventure during surgical and medical care: Secondary | ICD-10-CM | POA: Insufficient documentation

## 2017-10-30 DIAGNOSIS — Z79899 Other long term (current) drug therapy: Secondary | ICD-10-CM | POA: Insufficient documentation

## 2017-10-30 DIAGNOSIS — T8189XA Other complications of procedures, not elsewhere classified, initial encounter: Secondary | ICD-10-CM | POA: Insufficient documentation

## 2017-10-30 DIAGNOSIS — Z91010 Allergy to peanuts: Secondary | ICD-10-CM | POA: Insufficient documentation

## 2017-10-30 DIAGNOSIS — M5126 Other intervertebral disc displacement, lumbar region: Secondary | ICD-10-CM | POA: Diagnosis not present

## 2017-10-30 DIAGNOSIS — M47816 Spondylosis without myelopathy or radiculopathy, lumbar region: Secondary | ICD-10-CM | POA: Diagnosis not present

## 2017-10-30 DIAGNOSIS — M545 Low back pain: Secondary | ICD-10-CM | POA: Diagnosis not present

## 2017-10-30 DIAGNOSIS — T819XXA Unspecified complication of procedure, initial encounter: Secondary | ICD-10-CM | POA: Diagnosis not present

## 2017-10-30 DIAGNOSIS — T8140XA Infection following a procedure, unspecified, initial encounter: Secondary | ICD-10-CM | POA: Diagnosis not present

## 2017-10-30 DIAGNOSIS — R110 Nausea: Secondary | ICD-10-CM | POA: Diagnosis not present

## 2017-10-30 DIAGNOSIS — J45909 Unspecified asthma, uncomplicated: Secondary | ICD-10-CM | POA: Diagnosis not present

## 2017-10-30 DIAGNOSIS — Z91040 Latex allergy status: Secondary | ICD-10-CM | POA: Diagnosis not present

## 2017-10-30 DIAGNOSIS — F17210 Nicotine dependence, cigarettes, uncomplicated: Secondary | ICD-10-CM | POA: Insufficient documentation

## 2017-10-30 DIAGNOSIS — T148XXA Other injury of unspecified body region, initial encounter: Secondary | ICD-10-CM

## 2017-10-30 LAB — CBC
HCT: 38.5 % (ref 36.0–46.0)
Hemoglobin: 13.1 g/dL (ref 12.0–15.0)
MCH: 31.6 pg (ref 26.0–34.0)
MCHC: 34 g/dL (ref 30.0–36.0)
MCV: 93 fL (ref 78.0–100.0)
PLATELETS: 438 10*3/uL — AB (ref 150–400)
RBC: 4.14 MIL/uL (ref 3.87–5.11)
RDW: 12.3 % (ref 11.5–15.5)
WBC: 13.2 10*3/uL — ABNORMAL HIGH (ref 4.0–10.5)

## 2017-10-30 LAB — COMPREHENSIVE METABOLIC PANEL
ALBUMIN: 4.2 g/dL (ref 3.5–5.0)
ALK PHOS: 70 U/L (ref 38–126)
ALT: 15 U/L (ref 14–54)
ANION GAP: 11 (ref 5–15)
AST: 15 U/L (ref 15–41)
BUN: 11 mg/dL (ref 6–20)
CO2: 24 mmol/L (ref 22–32)
Calcium: 9.8 mg/dL (ref 8.9–10.3)
Chloride: 105 mmol/L (ref 101–111)
Creatinine, Ser: 0.67 mg/dL (ref 0.44–1.00)
GFR calc Af Amer: >60 mL/min (ref >60)
GFR calc non Af Amer: >60 mL/min (ref >60)
GLUCOSE: 110 mg/dL — AB (ref 65–99)
Potassium: 4.2 mmol/L (ref 3.5–5.1)
SODIUM: 140 mmol/L (ref 135–145)
Total Bilirubin: 0.9 mg/dL (ref 0.3–1.2)
Total Protein: 7.3 g/dL (ref 6.5–8.1)

## 2017-10-30 LAB — URINALYSIS, ROUTINE W REFLEX MICROSCOPIC
Glucose, UA: NEGATIVE mg/dL
Hgb urine dipstick: NEGATIVE
Ketones, ur: 5 mg/dL — AB
Nitrite: NEGATIVE
PH: 5 (ref 5.0–8.0)
Protein, ur: NEGATIVE mg/dL
SPECIFIC GRAVITY, URINE: 1.033 — AB (ref 1.005–1.030)

## 2017-10-30 LAB — I-STAT BETA HCG BLOOD, ED (MC, WL, AP ONLY)

## 2017-10-30 NOTE — ED Triage Notes (Signed)
The pt had lower back disc surgery April 23/  Earlier today she was seen by dr Venetia Maxon who did her surgery and after she  Left the office she started getting drainage from the lower back incision.  No temp  lmp may 1st

## 2017-10-31 ENCOUNTER — Emergency Department (HOSPITAL_COMMUNITY): Payer: BLUE CROSS/BLUE SHIELD

## 2017-10-31 DIAGNOSIS — M47816 Spondylosis without myelopathy or radiculopathy, lumbar region: Secondary | ICD-10-CM | POA: Diagnosis not present

## 2017-10-31 DIAGNOSIS — M5126 Other intervertebral disc displacement, lumbar region: Secondary | ICD-10-CM | POA: Diagnosis not present

## 2017-10-31 MED ORDER — GADOBENATE DIMEGLUMINE 529 MG/ML IV SOLN
11.0000 mL | Freq: Once | INTRAVENOUS | Status: AC | PRN
  Administered 2017-10-31: 11 mL via INTRAVENOUS

## 2017-10-31 MED ORDER — PROMETHAZINE HCL 25 MG/ML IJ SOLN
12.5000 mg | Freq: Once | INTRAMUSCULAR | Status: AC
  Administered 2017-10-31: 12.5 mg via INTRAVENOUS
  Filled 2017-10-31: qty 1

## 2017-10-31 MED ORDER — KETOROLAC TROMETHAMINE 15 MG/ML IJ SOLN
15.0000 mg | Freq: Once | INTRAMUSCULAR | Status: AC
  Administered 2017-10-31: 15 mg via INTRAVENOUS
  Filled 2017-10-31: qty 1

## 2017-10-31 MED ORDER — VANCOMYCIN HCL IN DEXTROSE 1-5 GM/200ML-% IV SOLN
1000.0000 mg | Freq: Once | INTRAVENOUS | Status: AC
  Administered 2017-10-31: 1000 mg via INTRAVENOUS
  Filled 2017-10-31: qty 200

## 2017-10-31 MED ORDER — ONDANSETRON HCL 4 MG/2ML IJ SOLN
4.0000 mg | Freq: Once | INTRAMUSCULAR | Status: AC
Start: 2017-10-31 — End: 2017-10-31
  Administered 2017-10-31: 4 mg via INTRAVENOUS
  Filled 2017-10-31: qty 2

## 2017-10-31 MED ORDER — PIPERACILLIN-TAZOBACTAM 3.375 G IVPB 30 MIN
3.3750 g | Freq: Once | INTRAVENOUS | Status: AC
  Administered 2017-10-31: 3.375 g via INTRAVENOUS
  Filled 2017-10-31: qty 50

## 2017-10-31 NOTE — Discharge Instructions (Signed)
Go straight from the emergency department to Dr. Fredrich Birks office.

## 2017-10-31 NOTE — ED Notes (Signed)
  Patient has had multiple episodes of vomiting.  Was given  zofran IVP.

## 2017-10-31 NOTE — ED Provider Notes (Signed)
22 year old received at sign out from [place] [facility] pending MRI. Per her HPI:  "Patient here with concern for purulent drainage from a surgical incision to lumbar spine. She underwent lumbar discectomy at the end of last month by Dr. Venetia Maxon. She reports routine follow up appointment yesterday where there was swelling around the incision site but no drainage. After she got home from the visit, she and her mother noticed purulent drainage from the incision. She states it is less swollen now but drainage continues. No fever, increased post-op pain, radiculopathy, abdominal or pelvic pain."   Physical Exam  BP 100/62   Pulse 67   Temp 98.3 F (36.8 C) (Oral)   Resp 17   Ht  (1.626 m)   Wt 60.3 kg (133 lb)   LMP 10/16/2017   SpO2 94%   BMI 22.83 kg/m   Physical Exam  No spontaneous drainage from midline wound of the lumbar spine.  Purulent drainage is expressed with palpation.  ED Course/Procedures     Procedures  MDM  22 year old female received at signout from [facility] up still pending lumbar spine MRI.  She underwent lumbar discectomy at the end of last month with Dr. Venetia Maxon.  Spoke with Dr. Venetia Maxon, neurosurgery, who recommends the patient be discharged from the ED and come straight to his office.  He plans to initiate the patient on Keflex, discontinue corticosteroids, and educated on home dressing changes.  Wound culture sent from the ED by [facility].  Spoke with the patient and her mother regarding the plan.  All questions answered and they are both agreeable at this time.  She is hemodynamically stable and in no acute distress.  She is safe for discharge to follow-up with Dr. Venetia Maxon in his office this afternoon.      Barkley Boards, PA-C 10/31/17 1051    Palumbo, April, MD 10/31/17 2320

## 2017-10-31 NOTE — ED Notes (Signed)
  Dressing was changed on lower back.  Previous dressing was saturated with about 10 ml of light brown drainage.

## 2017-10-31 NOTE — ED Notes (Signed)
Pt states she has had MRIs in the past and has not needed medication for anxiety

## 2017-10-31 NOTE — ED Notes (Signed)
Pt A&OX4, ambulatory at d/c with independent steady gait, NAD. Pt declined wheelchair. Pt reports she has all of her belongings with her at discharge.

## 2017-10-31 NOTE — ED Provider Notes (Signed)
[facility] EMERGENCY DEPARTMENT Provider Note   CSN: [id number] Arrival date & time: 10/30/17  2151     History   Chief Complaint Chief Complaint  Patient presents with  . Post-op Problem    HPI Janet White is a 22 y.o. female.  Patient here with concern for purulent drainage from a surgical incision to lumbar spine. She underwent lumbar discectomy at the end of last month by Dr. Venetia Maxon. She reports routine follow up appointment yesterday where there was swelling around the incision site but no drainage. After she got home from the visit, she and her mother noticed purulent drainage from the incision. She states it is less swollen now but drainage continues. No fever, increased post-op pain, radiculopathy, abdominal or pelvic pain.   The history is provided by the patient. No language interpreter was used.    Past Medical History:  Diagnosis Date  . Allergy   . Asthma   . Eczema     Patient Active Problem List   Diagnosis Date Noted  . Allergic urticaria 05/09/2015  . Eczema 12/12/2012    History reviewed. No pertinent surgical history.   OB History   None      Home Medications    Prior to Admission medications   Medication Sig Start Date End Date Taking? Authorizing Provider  albuterol (VENTOLIN HFA) 108 (90 Base) MCG/ACT inhaler Inhale 2 puffs into the lungs every 6 (six) hours as needed for wheezing. 07/13/16  Yes Hawks, Christy A, FNP  Dupilumab (DUPIXENT) 300 MG/2ML SOSY Inject 300 mg into the skin as directed. Every 15 days   Yes [provider]  EPINEPHrine (EPI-PEN) 0.3 mg/0.3 mL SOAJ Inject 0.3 mLs (0.3 mg total) into the muscle once. Patient taking differently: Inject 0.3 mg into the muscle as needed (for allergic reaction).  01/15/13  Yes Martin, Shanetra-Margaret, FNP  methocarbamol (ROBAXIN) 500 MG tablet Take 500 mg by mouth every 6 (six) hours as needed for muscle spasms.  10/08/17  Yes [provider]    methylPREDNISolone (MEDROL DOSEPAK) 4 MG TBPK tablet Take 4-24 mg by mouth See admin instructions. Take 6 tablets on day 1 then decrease by 1 tablet daily until all taken 10/30/17  Yes [provider]  montelukast (SINGULAIR) 10 MG tablet Take 1 tablet (10 mg total) by mouth at bedtime. 04/25/15  Yes Johna Sheriff, MD  cephALEXin (KEFLEX) 500 MG capsule Take 1 capsule (500 mg total) by mouth 2 (two) times daily. Patient not taking: Reported on 10/31/2017 01/21/17   Jannifer Rodney A, FNP  clobetasol ointment (TEMOVATE) 0.05 % Apply 1 application topically 2 (two) times daily. Patient not taking: Reported on 10/31/2017 08/20/16   Bennie Pierini, FNP  Crisaborole (EUCRISA) 2 % OINT Apply 1 application topically 2 (two) times daily. Patient not taking: Reported on 10/31/2017 01/21/17   Junie Spencer, FNP  etonogestrel-ethinyl estradiol (NUVARING) 0.12-0.015 MG/24HR vaginal ring INSERT VAGINALLY AND LEAVE IN PLACE FOR 3 CONSECUTIVE WEEKS, THEN REMOVE FOR 1 WEEK. Patient not taking: Reported on 10/31/2017 10/11/17   Bennie Pierini, FNP  fluticasone (FLONASE) 50 MCG/ACT nasal spray Place 2 sprays into both nostrils daily. Patient not taking: Reported on 10/31/2017 01/21/17   Jannifer Rodney A, FNP  hydrOXYzine (ATARAX/VISTARIL) 25 MG tablet Take 1 tablet TID prn for itch Patient not taking: Reported on 10/31/2017 04/25/15   Johna Sheriff, MD  meloxicam (MOBIC) 15 MG tablet Take 1 tablet (15 mg total) by mouth daily. Patient not taking: Reported  on 10/31/2017 09/10/16   Bennie Pierini, FNP  triamcinolone cream (KENALOG) 0.1 % Apply 1 application topically 2 (two) times daily. Patient not taking: Reported on 10/31/2017 04/25/15   Johna Sheriff, MD    Family History Family History  Problem Relation Age of Onset  . Cancer Mother        breast  . Cancer Maternal Grandmother        breast ca    Social History Social History   Tobacco Use  . Smoking status: Current Every Day  Smoker    Packs/day: 0.50    Types: Cigarettes  . Smokeless tobacco: Never Used  Substance Use Topics  . Alcohol use: Yes    Comment: occasional  . Drug use: No     Allergies   Latex; Peanuts [peanut oil]; and Sulfa antibiotics   Review of Systems Review of Systems  Constitutional: Negative for chills and fever.  Respiratory: Negative.   Cardiovascular: Negative.   Gastrointestinal: Positive for nausea.  Genitourinary: Negative.   Musculoskeletal: Positive for back pain.       Low back pain post-op, unchanged.  Skin: Positive for wound.  Neurological: Negative.  Negative for weakness and numbness.     Physical Exam Updated Vital Signs BP 117/86   Pulse 83   Temp 98.3 F (36.8 C) (Oral)   Resp 17   Ht  (1.626 m)   Wt 60.3 kg (133 lb)   LMP 10/16/2017   SpO2 99%   BMI 22.83 kg/m   Physical Exam  Constitutional: She appears well-developed and well-nourished.  Abdominal: There is no tenderness.  Musculoskeletal: Normal range of motion.  Vertical midline incision in lumbar spine. There is expressable copious purulent drainage from the wound. No malodor. Minimally tender. No erythema in the surrounding area.   Neurological: She is alert. Coordination normal.  Skin: Skin is warm. No erythema.     ED Treatments / Results  Labs (all labs ordered are listed, but only abnormal results are displayed) Labs Reviewed  COMPREHENSIVE METABOLIC PANEL - Abnormal; Notable for the following components:      Result Value   Glucose, Bld 110 (*)    All other components within normal limits  CBC - Abnormal; Notable for the following components:   WBC 13.2 (*)    Platelets 438 (*)    All other components within normal limits  URINALYSIS, ROUTINE W REFLEX MICROSCOPIC - Abnormal; Notable for the following components:   APPearance HAZY (*)    Specific Gravity, Urine 1.033 (*)    Bilirubin Urine MODERATE (*)    Ketones, ur 5 (*)    Leukocytes, UA TRACE (*)    Bacteria,  UA RARE (*)    All other components within normal limits  I-STAT BETA HCG BLOOD, ED ([facility], [facility], [facility] ONLY)    EKG None  Radiology No results found.  Procedures Procedures (including critical care time)  Medications Ordered in ED Medications  vancomycin (VANCOCIN) IVPB 1000 mg/200 mL premix (1,000 mg Intravenous New Bag/Given 10/31/17 0543)  piperacillin-tazobactam (ZOSYN) IVPB 3.375 g (0 g Intravenous Stopped 10/31/17 0543)  ketorolac (TORADOL) 15 MG/ML injection 15 mg (15 mg Intravenous Given 10/31/17 0513)  ondansetron (ZOFRAN) injection 4 mg (4 mg Intravenous Given 10/31/17 0541)     Initial Impression / Assessment and Plan / ED Course  I have reviewed the triage vital signs and the nursing notes.  Pertinent labs & imaging results that were available during my care of the patient were  reviewed by me and considered in my medical decision making (see chart for details).     Patient here with concern for post-operative infection because of purulent drainage that started yesterday. No fever, vomiting, cough or SOB.  Approximately 4cc purulent, thick fluid expressed.. Mild leukocytosis. No fever.   Wil obtain MR to evaluate depth of infection.   Patient care signed out to [facility]  Pending MRI for further study. Patient care signed out to [facility], PA-C. Anticipate discharge home. .    Final Clinical Impressions(s) / ED Diagnoses   Final diagnoses:  None   1. Post-op infection  ED Discharge Orders    None       Elpidio Anis, PA-C 11/01/17 0750    Palumbo, April, MD 11/01/17 [phone]

## 2017-11-02 LAB — AEROBIC CULTURE  (SUPERFICIAL SPECIMEN)

## 2017-11-02 LAB — AEROBIC CULTURE W GRAM STAIN (SUPERFICIAL SPECIMEN): Special Requests: NORMAL

## 2017-11-03 ENCOUNTER — Telehealth: Payer: Self-pay

## 2017-11-03 NOTE — Telephone Encounter (Signed)
Aerobic abcess culture report from ED visit 10/31/17 Faxed to Dr Fredrich Birks office per Terance Hart PA-C [phone]

## 2017-11-07 DIAGNOSIS — T8149XA Infection following a procedure, other surgical site, initial encounter: Secondary | ICD-10-CM | POA: Diagnosis not present

## 2017-11-18 DIAGNOSIS — L209 Atopic dermatitis, unspecified: Secondary | ICD-10-CM | POA: Diagnosis not present

## 2017-11-18 DIAGNOSIS — L089 Local infection of the skin and subcutaneous tissue, unspecified: Secondary | ICD-10-CM | POA: Diagnosis not present

## 2017-12-30 ENCOUNTER — Ambulatory Visit: Payer: BLUE CROSS/BLUE SHIELD | Admitting: Family Medicine

## 2017-12-30 ENCOUNTER — Encounter: Payer: Self-pay | Admitting: Infectious Diseases

## 2017-12-30 DIAGNOSIS — T8149XA Infection following a procedure, other surgical site, initial encounter: Secondary | ICD-10-CM | POA: Diagnosis not present

## 2018-01-07 ENCOUNTER — Encounter: Payer: Self-pay | Admitting: Infectious Diseases

## 2018-01-07 DIAGNOSIS — M5416 Radiculopathy, lumbar region: Secondary | ICD-10-CM | POA: Diagnosis not present

## 2018-01-07 DIAGNOSIS — T8149XA Infection following a procedure, other surgical site, initial encounter: Secondary | ICD-10-CM | POA: Diagnosis not present

## 2018-01-07 DIAGNOSIS — M545 Low back pain: Secondary | ICD-10-CM | POA: Diagnosis not present

## 2018-01-10 ENCOUNTER — Other Ambulatory Visit (HOSPITAL_COMMUNITY): Payer: Self-pay | Admitting: Student

## 2018-01-10 DIAGNOSIS — M4646 Discitis, unspecified, lumbar region: Secondary | ICD-10-CM

## 2018-01-13 DIAGNOSIS — M545 Low back pain: Secondary | ICD-10-CM | POA: Diagnosis not present

## 2018-01-13 DIAGNOSIS — M4647 Discitis, unspecified, lumbosacral region: Secondary | ICD-10-CM | POA: Diagnosis not present

## 2018-01-13 DIAGNOSIS — M5127 Other intervertebral disc displacement, lumbosacral region: Secondary | ICD-10-CM | POA: Diagnosis not present

## 2018-01-13 DIAGNOSIS — M5416 Radiculopathy, lumbar region: Secondary | ICD-10-CM | POA: Diagnosis not present

## 2018-01-13 NOTE — Progress Notes (Signed)
Patient: Janet White  DOB: 1995/12/10 MRN: [id number] PCP: Chevis Pretty, FNP  Referring Provider: Vertell Limber   Patient Active Problem List   Diagnosis Date Noted  . 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 Medication monitoring encounter 01/14/2018  . Needs peripherally inserted central catheter (PICC) 01/14/2018  . 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 Allergic urticaria 05/09/2015  . Eczema 12/12/2012     Subjective:   Chief Complaint  Patient presents with  . New Patient (Initial Visit)    discitis - sent over by Dr. Linus Salmons and Dr. Vertell Limber    HPI/ROS:  Janet White is a 22 y.o. female here today for evaluation of lumbar spine discitis. She is s/p lumbar diskectomy in late April 2019 by Dr. Vertell Limber. Was seen in the ED May 15th for purulent drainage involving surgical incision. At that time she was not having any systemic symptoms of fever/chills or increased pain. MRI was obtained revealing a 2.7 x 2.5 x 3.5 cm fluid collection extending to the skin with fluid that extended into the right hemilaminectomy defect. A superficial would culture of the drainage was obtained at that time grew sensitive staphylococcal aureus. She was given Keflex from ER and instructed to follow up with Dr. Vertell Limber in the office. At that visit she was switched to ciprofloxacin x 4 weeks; no surgery was done at this time regarding clean out. Got better with treatment up until 2-3 weeks following stopping antibiotics when she started experiencing a lot of bone pain over the previous surgery site with radiating pains down both lets that "makes her feel like she has arthritis all over." She is scheduled with [facility] Interventional Radiology team today for an IR guided aspiration today.   She has a significant history of severe allergy/asthma and ezcema since a child. She is maintained on Dupixent injections for this through dermatology team, however her has been delayed  with current infection.   Review of Systems  All other systems reviewed and are negative.   Past Medical History:  Diagnosis Date  . Allergy   . Asthma   . 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 Eczema     Outpatient Medications Prior to Visit  Medication Sig Dispense Refill  . albuterol (VENTOLIN HFA) 108 (90 Base) MCG/ACT inhaler Inhale 2 puffs into the lungs every 6 (six) hours as needed for wheezing. 1 Inhaler 2  . Dupilumab (DUPIXENT) 300 MG/2ML SOSY Inject 300 mg into the skin as directed. Every 15 days    . EPINEPHrine (EPI-PEN) 0.3 mg/0.3 mL SOAJ Inject 0.3 mLs (0.3 mg total) into the muscle once. (Patient taking differently: Inject 0.3 mg into the muscle as needed (for allergic reaction). ) 1 Device 11  . methocarbamol (ROBAXIN) 500 MG tablet Take 500 mg by mouth every 6 (six) hours as needed for muscle spasms.   0  . methylPREDNISolone (MEDROL DOSEPAK) 4 MG TBPK tablet Take 4-24 mg by mouth See admin instructions. Take 6 tablets on day 1 then decrease by 1 tablet daily until all taken  0  . montelukast (SINGULAIR) 10 MG tablet Take 1 tablet (10 mg total) by mouth at bedtime. 30 tablet 5   No facility-administered medications prior to visit.      Allergies  Allergen Reactions  . Latex Rash  . Peanuts [Peanut Oil]     All nuts   . Sulfa Antibiotics     Social History   Tobacco Use  . Smoking status: Never Smoker  .  Smokeless tobacco: Never Used  Substance Use Topics  . Alcohol use: Yes    Comment: occasional  . Drug use: No    Family History  Problem Relation Age of Onset  . Cancer Mother        breast  . Cancer Maternal Grandmother        breast ca    Objective:   Vitals:   01/14/18 0937  BP: 101/67  Pulse: 64  Temp: (!) 97.4 F (36.3 C)  Weight: 134 lb (60.8 kg)   Body mass index is 23 kg/m.  Physical Exam  Constitutional: She is oriented to person, place, and time. She appears well-developed and well-nourished.  Not acutely ill. Appears  to be in pain. Mother is present.   HENT:  Mouth/Throat: Oropharynx is clear and moist. No oral lesions. No dental abscesses.  Cardiovascular: Normal rate.  Pulmonary/Chest: Effort normal. No respiratory distress.  Abdominal: She exhibits no distension.  Musculoskeletal: Normal range of motion. She exhibits no tenderness.  Lymphadenopathy:    She has no cervical adenopathy.  Neurological: She is alert and oriented to person, place, and time.  Skin: Skin is warm and dry. No rash noted.     Psychiatric: Judgment normal.  Vitals reviewed.   Lab Results: Lab Results  Component Value Date   WBC 12.0 (H) 01/14/2018   HGB 13.2 01/14/2018   HCT 39.6 01/14/2018   MCV 95.4 01/14/2018   PLT 329 01/14/2018    Lab Results  Component Value Date   CREATININE 0.67 10/30/2017   BUN 11 10/30/2017   NA 140 10/30/2017   K 4.2 10/30/2017   CL 105 10/30/2017   CO2 24 10/30/2017    Lab Results  Component Value Date   ALT 15 10/30/2017   AST 15 10/30/2017   ALKPHOS 70 10/30/2017   BILITOT 0.9 10/30/2017     Assessment & Plan:   Problem List Items Addressed This Visit      Musculoskeletal and Integument   Eczema    Currently flarred with witholding her Dupixent - I do not see where this severely alters her immune system that I would recommend continuing to hold but will review with Cassie our ID pharmacist and call her with decision. Encouraged topical steroids for now. No lesions appear to be infected.       Discitis of lumbar region    Young lady with what sounds to be discitis @ L5 after early post operative wound infection with MSSA (10-2017 swab of incision drainage). Relapsed following 4 weeks of PO cipro and now with evidence of deeper infection involving the bone/disc. Does not appear to have any retained hardware per MRI in May. I discussed course of therapy for vertebral discitis/osteomyelitis and advised that sometimes surgery is warranted if there is organized abscess or  hardware present to help achieve a higher rate of cure - Will request imaging/office notes and labs from Dr. Melven Sartorius office to assist with treatment decisions.  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 We discussed PICC line insertion, care/maintenance as well as [place] assistance. 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 I told her she can definitely do the desk job but would request if she could consider hosting/expo or other light duty at the restaurant to avoid PICC dislodgement and also for back pain control.   I welcomed and answered all questions today. Will  follow up culture result on disc aspirate and adjust antibiotics as indicated.   OPAT ORDERS:  Diagnosis: Discitis  Culture Result: MSSA in May (lumbar aspiration with culture 01/14/2018)   Allergies  Allergen Reactions  . Latex Rash  . Peanuts [Peanut Oil]     All nuts   . Sulfa Antibiotics     Discharge antibiotics: Cefazolin 2 gm IV Q8h   Duration: 8 weeks   End Date: TBD pending first dose (approx 03/11/18)  PIC Care and Maintenance Per Protocol __ Please pull PIC at completion of IV antibiotics _x_ Please leave PIC in place until doctor has seen patient or been notified  Labs weekly while on IV antibiotics: _x_ CBC with differential _x_ BMP _x_ CRP _x_ ESR  Fax weekly labs to [phone]  Clinic Follow Up Appt: Return in about 6 weeks (around 02/25/2018).          Other   Needs peripherally inserted central catheter (PICC) - Primary    Orders entered for placement. Counseled on risks of therapy with prolonged use including infection, blood clot and dislodgement. 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       Relevant Orders   IRPICC PLACEMENT LEFT >5 YRS INC IMG GUIDE   Medication monitoring encounter    Lab Results    Component Value Date   CREATININE 0.67 10/30/2017   Medications appropriate dosed.       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 Likely will not be able to do bleach baths until after PICC out or can do shallow bath just higher than her waist to avoid PICC from getting wet.       Relevant Medications   mupirocin ointment (BACTROBAN) 2 %     60 minutes with the patient > 50% in face to face counsel/discussion regarding the above and coordination of her care.   Janene Madeira, MSN, NP-C [facility] [place] Pager: [phone] Office: [phone]  01/14/18  12:25 PM

## 2018-01-14 ENCOUNTER — Encounter: Payer: Self-pay | Admitting: Infectious Diseases

## 2018-01-14 ENCOUNTER — Other Ambulatory Visit: Payer: Self-pay

## 2018-01-14 ENCOUNTER — Telehealth: Payer: Self-pay | Admitting: *Deleted

## 2018-01-14 ENCOUNTER — Ambulatory Visit (HOSPITAL_COMMUNITY)
Admission: RE | Admit: 2018-01-14 | Discharge: 2018-01-14 | Disposition: A | Discharge status: Home / Self Care | Payer: BLUE CROSS/BLUE SHIELD | Source: Ambulatory Visit | Attending: Student | Admitting: Student

## 2018-01-14 ENCOUNTER — Other Ambulatory Visit: Payer: Self-pay | Admitting: Infectious Diseases

## 2018-01-14 ENCOUNTER — Ambulatory Visit: Payer: BLUE CROSS/BLUE SHIELD | Admitting: Infectious Diseases

## 2018-01-14 VITALS — BP 101/67 | HR 64 | Temp 97.4°F | Wt 134.0 lb

## 2018-01-14 DIAGNOSIS — L309 Dermatitis, unspecified: Secondary | ICD-10-CM | POA: Diagnosis not present

## 2018-01-14 DIAGNOSIS — Z22321 Carrier or suspected carrier of Methicillin susceptible Staphylococcus aureus: Secondary | ICD-10-CM | POA: Diagnosis not present

## 2018-01-14 DIAGNOSIS — Z91040 Latex allergy status: Secondary | ICD-10-CM | POA: Insufficient documentation

## 2018-01-14 DIAGNOSIS — Z5181 Encounter for therapeutic drug level monitoring: Secondary | ICD-10-CM

## 2018-01-14 DIAGNOSIS — J45909 Unspecified asthma, uncomplicated: Secondary | ICD-10-CM | POA: Insufficient documentation

## 2018-01-14 DIAGNOSIS — Z452 Encounter for adjustment and management of vascular access device: Secondary | ICD-10-CM | POA: Diagnosis not present

## 2018-01-14 DIAGNOSIS — M4646 Discitis, unspecified, lumbar region: Secondary | ICD-10-CM

## 2018-01-14 DIAGNOSIS — T8140XA Infection following a procedure, unspecified, initial encounter: Secondary | ICD-10-CM | POA: Insufficient documentation

## 2018-01-14 DIAGNOSIS — Y838 Other surgical procedures as the cause of abnormal reaction of the patient, or of later complication, without mention of misadventure at the time of the procedure: Secondary | ICD-10-CM | POA: Diagnosis not present

## 2018-01-14 DIAGNOSIS — Z882 Allergy status to sulfonamides status: Secondary | ICD-10-CM | POA: Insufficient documentation

## 2018-01-14 DIAGNOSIS — M4647 Discitis, unspecified, lumbosacral region: Secondary | ICD-10-CM | POA: Diagnosis not present

## 2018-01-14 HISTORY — DX: Discitis, unspecified, lumbar region: M46.46

## 2018-01-14 HISTORY — PX: IR LUMBAR DISC ASPIRATION W/IMG GUIDE: IMG5306

## 2018-01-14 LAB — CBC
HCT: 39.6 % (ref 36.0–46.0)
Hemoglobin: 13.2 g/dL (ref 12.0–15.0)
MCH: 31.8 pg (ref 26.0–34.0)
MCHC: 33.3 g/dL (ref 30.0–36.0)
MCV: 95.4 fL (ref 78.0–100.0)
PLATELETS: 329 10*3/uL (ref 150–400)
RBC: 4.15 MIL/uL (ref 3.87–5.11)
RDW: 13 % (ref 11.5–15.5)
WBC: 12 10*3/uL — ABNORMAL HIGH (ref 4.0–10.5)

## 2018-01-14 LAB — PROTIME-INR
INR: 0.98
PROTHROMBIN TIME: 12.9 s (ref 11.4–15.2)

## 2018-01-14 LAB — PREGNANCY, URINE: PREG TEST UR: NEGATIVE

## 2018-01-14 MED ORDER — MIDAZOLAM HCL 2 MG/2ML IJ SOLN
INTRAMUSCULAR | Status: AC | PRN
  Administered 2018-01-14 (×2): 1 mg via INTRAVENOUS

## 2018-01-14 MED ORDER — KETOROLAC TROMETHAMINE 30 MG/ML IJ SOLN
30.0000 mg | Freq: Once | INTRAMUSCULAR | Status: AC
  Administered 2018-01-14: 30 mg via INTRAVENOUS

## 2018-01-14 MED ORDER — FENTANYL CITRATE (PF) 100 MCG/2ML IJ SOLN
INTRAMUSCULAR | Status: AC | PRN
  Administered 2018-01-14 (×2): 50 ug via INTRAVENOUS

## 2018-01-14 MED ORDER — HEPARIN SOD (PORK) LOCK FLUSH 100 UNIT/ML IV SOLN
INTRAVENOUS | Status: AC
  Administered 2018-01-14: 500 [IU]
  Filled 2018-01-14: qty 5

## 2018-01-14 MED ORDER — LIDOCAINE HCL (PF) 1 % IJ SOLN
INTRAMUSCULAR | Status: AC | PRN
  Administered 2018-01-14: 5 mL

## 2018-01-14 MED ORDER — FENTANYL CITRATE (PF) 100 MCG/2ML IJ SOLN
INTRAMUSCULAR | Status: AC
  Filled 2018-01-14: qty 4

## 2018-01-14 MED ORDER — MUPIROCIN 2 % EX OINT: 1.0000 "application " | TOPICAL_OINTMENT | Freq: Two times a day (BID) | CUTANEOUS | 0 refills | Status: DC

## 2018-01-14 MED ORDER — LIDOCAINE HCL 1 % IJ SOLN
INTRAMUSCULAR | Status: AC
  Filled 2018-01-14: qty 20

## 2018-01-14 MED ORDER — KETOROLAC TROMETHAMINE 30 MG/ML IJ SOLN
INTRAMUSCULAR | Status: AC
  Administered 2018-01-14: 30 mg via INTRAVENOUS
  Filled 2018-01-14: qty 1

## 2018-01-14 MED ORDER — MIDAZOLAM HCL 2 MG/2ML IJ SOLN
INTRAMUSCULAR | Status: AC
  Filled 2018-01-14: qty 4

## 2018-01-14 MED ORDER — SODIUM CHLORIDE 0.9 % IV SOLN
INTRAVENOUS | Status: DC
  Administered 2018-01-14: 12:00:00 via INTRAVENOUS

## 2018-01-14 MED ORDER — SODIUM CHLORIDE 0.9 % IV SOLN
INTRAVENOUS | Status: AC | PRN
  Administered 2018-01-14: 10 mL/h via INTRAVENOUS

## 2018-01-14 NOTE — Telephone Encounter (Signed)
Called the patient to advise of PICC placement appt and was advised that she already had it placed while at hospital today for biopsy. She did not get the first dose however advised should be getting a call from [facility] soon to set something up.  Called [facility] and was advised by Jeri ModenaPam Chandler she was aware to PICC placement and the patient will have first dose at home tomorrow 01/15/18 as she has had this medication before. She advised someone from [facility] will call the patient today to set it up.  Rexene AlbertsStephanie Dixon NP is aware of the plan.

## 2018-01-14 NOTE — Assessment & Plan Note (Signed)
Young lady with what sounds to be discitis @ L5 after early post operative wound infection with MSSA (10-2017 swab of incision drainage). Relapsed following 4 weeks of PO cipro and now with evidence of deeper infection involving the bone/disc. Does not appear to have any retained hardware per MRI in May. I discussed course of therapy for vertebral discitis/osteomyelitis and advised that sometimes surgery is warranted if there is organized abscess or hardware present to help achieve a higher rate of cure - Will request imaging/office notes and labs from Dr. Melven Sartorius office to assist with treatment decisions.  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 We discussed PICC line insertion, care/maintenance as well as [place] assistance. 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 I told her she can definitely do the desk job but would request if she could consider hosting/expo or other light duty at the restaurant to avoid PICC dislodgement and also for back pain control.   I welcomed and answered all questions today. Will follow up culture result on disc aspirate and adjust antibiotics as indicated.   OPAT ORDERS:  Diagnosis: Discitis  Culture Result: MSSA in May (lumbar aspiration with culture 01/14/2018)   Allergies  Allergen Reactions  . Latex Rash  . Peanuts [Peanut Oil]     All nuts   . Sulfa Antibiotics     Discharge antibiotics: Cefazolin 2 gm IV Q8h   Duration: 8 weeks   End Date: TBD pending first dose (approx 03/11/18)  PIC Care and Maintenance Per Protocol __ Please pull PIC at completion of IV antibiotics _x_ Please leave PIC in place until doctor has seen patient or been notified  Labs weekly while on IV antibiotics: _x_ CBC with differential _x_ BMP _x_ CRP _x_ ESR  Fax weekly labs to [phone]  Clinic Follow Up Appt: Return in about 6 weeks (around 02/25/2018).

## 2018-01-14 NOTE — Assessment & Plan Note (Signed)
Orders entered for placement. Counseled on risks of therapy with prolonged use including infection, blood clot and dislodgement. 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

## 2018-01-14 NOTE — H&P (Signed)
Chief Complaint: Patient was seen in consultation today for L5-S1 disc aspiration at the request of Dr Imelda Pillow  Referring Physician(s): Dr Imelda Pillow  Supervising Physician: Jolaine Click  Patient Status: [facility] - Out-pt  History of Present Illness: Janet White is a 22 y.o. female   Lumbar 5 disc surgery 10/08/17 Developed infection 2 weeks post op-- 1 mo antibiotics New back pain; radiating down Rt x 1 week  7/23 MRI IMPRESSION: 1. Postoperative changes at L5-S1. Confluent vertebral and intervening disc enhancement at that level is beyond that typically seen at this point status post surgery. This could reflect interval severe disc degeneration, but Infectious Discitis-Osteomyelitis is difficult to exclude. However, there is no associated fluid collection to suggest abscess. 2. Right lateral recess patency at the S1 nerve level is improved following surgery but there is suspicion of broad-based disc material now involving the right L5 neural foramen. There also appears to be some soft tissue inflammation in the left neural foramen, associated with the edema in #1. 3. The other lumbar levels are stable and normal.  Request for L5-S1 disc aspiration Scheduled now for same  Past Medical History:  Diagnosis Date  . Allergy   . Asthma   . 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 Eczema     No past surgical history on file.  Allergies: Latex; Peanuts [peanut oil]; and Sulfa antibiotics  Medications: Prior to Admission medications   Medication Sig Start Date End Date Taking? Authorizing Provider  albuterol (VENTOLIN HFA) 108 (90 Base) MCG/ACT inhaler Inhale 2 puffs into the lungs every 6 (six) hours as needed for wheezing. 07/13/16   Hawks, Neysa Bonito A, FNP  Dupilumab (DUPIXENT) 300 MG/2ML SOSY Inject 300 mg into the skin as directed. Every 15 days    [provider]  EPINEPHrine (EPI-PEN) 0.3 mg/0.3 mL SOAJ Inject 0.3 mLs (0.3 mg total) into the muscle  once. Patient taking differently: Inject 0.3 mg into the muscle as needed (for allergic reaction).  01/15/13   Daphine Deutscher, Mckynlee-Margaret, FNP  methocarbamol (ROBAXIN) 500 MG tablet Take 500 mg by mouth every 6 (six) hours as needed for muscle spasms.  10/08/17   [provider]  methylPREDNISolone (MEDROL DOSEPAK) 4 MG TBPK tablet Take 4-24 mg by mouth See admin instructions. Take 6 tablets on day 1 then decrease by 1 tablet daily until all taken 10/30/17   [provider]  montelukast (SINGULAIR) 10 MG tablet Take 1 tablet (10 mg total) by mouth at bedtime. 04/25/15   Johna Sheriff, MD  mupirocin ointment (BACTROBAN) 2 % Place 1 application into the nose 2 (two) times daily. 01/14/18   Blanchard Kelch, NP     Family History  Problem Relation Age of Onset  . Cancer Mother        breast  . Cancer Maternal Grandmother        breast ca    Social History   Socioeconomic History  . Marital status: Single    Spouse name: Not on file  . Number of children: Not on file  . Years of education: Not on file  . Highest education level: Not on file  Occupational History  . Not on file  Social Needs  . Financial resource strain: Not on file  . Food insecurity:    Worry: Not on file    Inability: Not on file  . Transportation needs:    Medical: Not on file    Non-medical: Not on file  Tobacco Use  .  Smoking status: Never Smoker  . Smokeless tobacco: Never Used  Substance and Sexual Activity  . Alcohol use: Yes    Comment: occasional  . Drug use: No  . Sexual activity: Not on file  Lifestyle  . Physical activity:    Days per week: Not on file    Minutes per session: Not on file  . Stress: Not on file  Relationships  . Social connections:    Talks on phone: Not on file    Gets together: Not on file    Attends religious service: Not on file    Active member of club or organization: Not on file    Attends meetings of clubs or organizations: Not on file    Relationship  status: Not on file  Other Topics Concern  . Not on file  Social History Narrative  . Not on file     Review of Systems: A 12 point ROS discussed and pertinent positives are indicated in the HPI above.  All other systems are negative.  Review of Systems  Constitutional: Positive for activity change. Negative for fatigue and fever.  Respiratory: Negative for cough and shortness of breath.   Cardiovascular: Negative for chest pain.  Gastrointestinal: Negative for abdominal pain.  Musculoskeletal: Positive for back pain.  Neurological: Negative for weakness.  Psychiatric/Behavioral: Negative for behavioral problems and confusion.    Vital Signs: BP 106/82 (BP Location: Right Arm)   Pulse 72   Temp 98.1 F (36.7 C) (Oral)   Ht 5\' 4"  (1.626 m)   Wt 134 lb (60.8 kg)   LMP 12/17/2017 (Approximate)   SpO2 100%   BMI 23.00 kg/m   Physical Exam  Constitutional: She is oriented to person, place, and time.  Cardiovascular: Normal rate, regular rhythm and normal heart sounds.  Pulmonary/Chest: Effort normal and breath sounds normal.  Abdominal: Soft. Bowel sounds are normal.  Musculoskeletal: Normal range of motion.  Neurological: She is alert and oriented to person, place, and time.  Skin: Skin is warm and dry.  Psychiatric: She has a normal mood and affect. Her behavior is normal. Judgment and thought content normal.  Vitals reviewed.   Imaging: No results found.  Labs:  CBC: Recent Labs    10/30/17 2217  WBC 13.2*  HGB 13.1  HCT 38.5  PLT 438*    COAGS: No results for input(s): INR, APTT in the last 8760 hours.  BMP: Recent Labs    10/30/17 2217  NA 140  K 4.2  CL 105  CO2 24  GLUCOSE 110*  BUN 11  CALCIUM 9.8  CREATININE 0.67  GFRNONAA >60  GFRAA >60    LIVER FUNCTION TESTS: Recent Labs    10/30/17 2217  BILITOT 0.9  AST 15  ALT 15  ALKPHOS 70  PROT 7.3  ALBUMIN 4.2    TUMOR MARKERS: No results for input(s): AFPTM, CEA, CA199,  CHROMGRNA in the last 8760 hours.  Assessment and Plan:  Previous L5 disc surgery 10/08/17 Post op infection-- tx with antibiotics New pain x 1 week and MRI revealing new collection Scheduled for L5-S1 disc aspiration Risks and benefits discussed with the patient including, but not limited to bleeding, infection, damage to adjacent structures or low yield requiring additional tests.  All of the patient's questions were answered, patient is agreeable to proceed. Consent signed and in chart.   Thank you for this interesting consult.  I greatly enjoyed meeting Janet White and look forward to participating in their care.  A  copy of this report was sent to the requesting provider on this date.  Electronically Signed: Robet Leu, PA-C 01/14/2018, 11:09 AM   I spent a total of  30 Minutes   in face to face in clinical consultation, greater than 50% of which was counseling/coordinating care for L5-S1 disc aspiration

## 2018-01-14 NOTE — Assessment & Plan Note (Signed)
Currently flarred with witholding her Dupixent - I do not see where this severely alters her immune system that I would recommend continuing to hold but will review with Cassie our ID pharmacist and call her with decision. Encouraged topical steroids for now. No lesions appear to be infected.

## 2018-01-14 NOTE — Patient Instructions (Signed)
Nice to meet you both!   Will get records of all scans, lab work and office notes from Dr. Venetia Maxon that you have been through since your surgery.   Will be on the look out for the results of the procedure today but I plan on starting with an IV antibiotic called Cefazolin - this is given 3 times a day and is an excellent choice for staph. This is what grew from your swab in May when you had the drainage.   Planning 8 weeks of treatment.   Will have you follow back up with either myself or Dr. Luciana Axe in 6 weeks to see how you are doing on your IV medication.   [facility] Five day Treatment Plan for Staph Decolonization  Please let us know if you have questions or concerns or do not understand the information we give you.  Prepare Chose a period when you will be uninterrupted by going away or other distractions. To ensure that your skin is in good condition, follow the Routine Skin Care principles below to reduce drying and enhance healing. Do not start while you have any active boils. Routine Skin Care principles to reduce drying and enhance healing:  Avoid the use of soap when bathing or showering. DO NOT routinely use antiseptic solutions. If you need to use something, chose a soap substitute (examples -QV Wash or Cetaphil).   When drying with a towel, be gentle and pat dry your skin. Avoid rubbing the skin.   Reduce the overall frequency of bathing or showering. A short shower (3 minutes) is better than a bath in terms of its effect on the skin.   Use a simple sorbelene based-cream on your skin prior to showering and immediately after drying. (Examples: Hydroderm or other Sorbelene-based preparations). Especially protect healing or dry areas of skin in this way. Don't use a barrier cream with a vaseline base or with perfumes and additives. You are more likely to be allergic to these products, and cause further damage to your skin.   Make sure that you clean and  cover any skin cuts or grazes that occur. Try to avoid picking or biting fingernails and the skin around the nails Keep your fingernails clipped short and clean to reduce problems caused by scratching.   For itchy skin, try gently massaging sorbelene-based cream into itchy areas instead of scratching it. A long-acting non-sedating antihistamine drug is the next option to try. However make sure that you are not taking medication that will interact with this drug type.                                                                         To prepare yourself for your treatment, it is recommended that you complete the following steps:  Remove nose, ear and other body piercing items for several days prior to the treatment and keep them out during the treatment period   Purchase a new toothbrush, disposable razor (if used), sterident for dentures (if required) and a container of alcohol hand hygiene solution (gel or rub)   Discard old toothbrushes and razors when the treatment starts. Also discard opened deodorant rollers, skin adhesive tapes, skin creams and solutions- all of  these may already be contaminated with staph   Discard pumice stone(s), sponges and disposable face cloths if used    Discard all make-up brushes, creams, and implements   Discard or hot wash all fluffy toys   Wash hair brush and comb, nail files, plastic toys and cutters in the dishwasher or purchase new ones   Remove nail varnish and artificial nails  Daily routine for 5 days     *Minimize contact with members of the community during the 5 days of treatment* Body washes The effectiveness of the program increases if the correct procedure is used:  Apply the provided antiseptic body wash (2% chlorhexidine) in the shower daily   Take care to wash hair, under the arms and into the groin and into any folds of skin   Allow the antiseptic to remain on the skin for at least 5 minutes  Nasal ointment  Disinfect your hands  with alcohol gel/rub and allow to dry    Open the mupirocin 2% (Bactroban) nasal ointment.   Place small amount (size of match head) of ointment onto a clean cotton swab and massage gently around the inside of the nostril on one side, making sure not to insert it too deeply (no more than 2 cm or a little less than an inch).   Use a new cotton bud for the other nostril so that you do not contaminate the Bactroban tube with staph.    After applying the ointment, press a finger against the nose next to the nostril opening and use a circular motion to spread the ointment within the nose   Apply the mupirocin ointment two times a day for 5 days   Disinfect your hands with alcohol rub/gel after applying the ointmentp>  Personal items (combs, razor, eyeglasses, jewelry, etc.)     Disinfect all personal items daily with an alcohol-based cleanser  House Environment and Clothes/Linens  On day 2 and 5 of the treatment, clean your house, (especially the bedroom and bathroom). Clean dust off all surfaces and then vacuum clean all floor surfaces AND soft furnishings (such as your favorite chair). If your chair/couch has a vinyl or leather covering then wipe over the chair with warm soapy water and then dry with a clean towel (which should then be washed). Staph lives in skin scales from humans that contaminate the environment. This can lead to re-infection.    Disinfect the shower floor and/or bath tub daily    On days 1, 3, AND 5 of the treatment wash your clothes, underwear, pajamas and bed linen (such as towels, sheets, washcloths, and bath mats). A hot wash with laundry detergent is best (there is no need to use expensive laundry detergent or powder). Dry clothes in sun if possible. Change into clean clothes or pajamas on those days after your shower.    Do not share or exchange any personal items of clothing  Sports/Gym     Surfaces, equipment and towels, and skin-to-skin contact are all potential  sources for staph re-infection Pets Dogs and other companion animals can also be colonized with the same strains of MRSA. Best to wash or replace bedding material for the animal and wash the animal at least once during the treatment period with antiseptic solution (2% chlorhexidine wash). Ensure that the skin of the animal is kept in good condition. If the pet has any chronic skin disorders, consult your vet prior to starting your treatment process. What about my partner, family, or household members?  Usually when an aggressive strain of staph moves in to a family or household, only certain members of that group get infections (boils). This is despite the fact that the strain has probably transferred itself from person to person within the group. Those without boils may also be carrying the bacterium, however they must have better resistance (immunity) or perhaps have better skin condition. Staph likes to invade through cuts, scratches and skin with dermatitis or dryness.    Follow-up after decolonization treatment  Possible approaches will vary depending on how your treatment goes. Your provider will instruct you on the follow-up best suited for you. Some options are:   Wait and see - if no further boils occur within 6 months then it is probable that the strain of staph has been eliminated from you.    Continue intermittent body washes 1-2 times per week with 2% aqueous chlorhexidine soap preparation or similar   Future antibiotic use  The best preventative approach to avoiding future problems may be to avoid use of antibiotics unless there is a strong indication. Antibiotics are often prescribed for minor infections or for respiratory infections that are mostly due to viruses. It is in your best interest to ride these infections out rather than taking antibiotics. Taking antibiotics alters your natural bacterial flora on your skin and in your gut. This may reduce your resistance against acquiring a  resistant bacterial strain such as MRSA).   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   References Wiese-Posselt et al. Clin Inf Dis [id number]  CDC:  [employer]se.html

## 2018-01-14 NOTE — Discharge Instructions (Signed)
Needle Biopsy, Care After These instructions give you information about caring for yourself after your procedure. Your doctor may also give you more specific instructions. Call your doctor if you have any problems or questions after your procedure. Follow these instructions at home:  Rest as told by your doctor.   Take medicines only as told by your doctor.  There are many different ways to close and cover the biopsy site, including stitches (sutures), skin glue, and adhesive strips. Follow instructions from your doctor about: ? How to take care of your biopsy site.      ? When you should remove your dressing.      Remove dressing in 24-48 hours  Check your biopsy site every day for signs of infection. Watch for: ? Redness, swelling, or pain. ? Fluid, blood, or pus. Contact a doctor if:  You have a fever.  You have redness, swelling, or pain at the biopsy site, and it lasts longer than a few days.  You have fluid, blood, or pus coming from the biopsy site.  You feel sick to your stomach (nauseous).  You throw up (vomit). Get help right away if:  You are short of breath.  You have trouble breathing.  Your chest hurts.  You feel dizzy or you pass out (faint).  You have bleeding that does not stop with pressure or a bandage.  You cough up blood.  Your belly (abdomen) hurts. This information is not intended to replace advice given to you by your health care provider. Make sure you discuss any questions you have with your health care provider. Document Released: 05/17/2008 Document Revised: 11/10/2015 Document Reviewed: 05/31/2014 Elsevier Interactive Patient Education  2018 Elsevier Inc.  Moderate Conscious Sedation, Adult, Care After These instructions provide you with information about caring for yourself after your procedure. Your health care provider may also give you more specific instructions. Your treatment has been planned according to current medical practices, but  problems sometimes occur. Call your health care provider if you have any problems or questions after your procedure. What can I expect after the procedure? After your procedure, it is common:  To feel sleepy for several hours.  To feel clumsy and have poor balance for several hours.  To have poor judgment for several hours.  To vomit if you eat too soon.  Follow these instructions at home: For at least 24 hours after the procedure:   Do not: ? Participate in activities where you could fall or become injured. ? Drive. ? Use heavy machinery. ? Drink alcohol. ? Take sleeping pills or medicines that cause drowsiness. ? Make important decisions or sign legal documents. ? Take care of children on your own.  Rest. Eating and drinking  Follow the diet recommended by your health care provider.  If you vomit: ? Drink water, juice, or soup when you can drink without vomiting. ? Make sure you have little or no nausea before eating solid foods. General instructions  Have a responsible adult stay with you until you are awake and alert.  Take over-the-counter and prescription medicines only as told by your health care provider.  If you smoke, do not smoke without supervision.  Keep all follow-up visits as told by your health care provider. This is important. Contact a health care provider if:  You keep feeling nauseous or you keep vomiting.  You feel light-headed.  You develop a rash.  You have a fever. Get help right away if:  You have trouble breathing.  This information is not intended to replace advice given to you by your health care provider. Make sure you discuss any questions you have with your health care provider. Document Released: 03/25/2013 Document Revised: 11/07/2015 Document Reviewed: 09/24/2015 Elsevier Interactive Patient Education  [employer]2018 Elsevier Inc.

## 2018-01-14 NOTE — Procedures (Signed)
PICC RUE 40 CM SVC RA L5/S1 disk aspiration - drops of bloody fluid EBL 0 COmp 0

## 2018-01-14 NOTE — Assessment & Plan Note (Signed)
Rx for mupirocin to use to nares and fingernails bid x 5d along with decolonization protocol. Likely will not be able to do bleach baths until after PICC out or can do shallow bath just higher than her waist to avoid PICC from getting wet.

## 2018-01-14 NOTE — Assessment & Plan Note (Signed)
Lab Results  Component Value Date   CREATININE 0.67 10/30/2017   Medications appropriate dosed.

## 2018-01-15 ENCOUNTER — Telehealth: Payer: Self-pay | Admitting: *Deleted

## 2018-01-15 DIAGNOSIS — A4101 Sepsis due to Methicillin susceptible Staphylococcus aureus: Secondary | ICD-10-CM | POA: Diagnosis not present

## 2018-01-15 DIAGNOSIS — T8149XA Infection following a procedure, other surgical site, initial encounter: Secondary | ICD-10-CM | POA: Diagnosis not present

## 2018-01-15 DIAGNOSIS — M4646 Discitis, unspecified, lumbar region: Secondary | ICD-10-CM | POA: Diagnosis not present

## 2018-01-15 NOTE — Progress Notes (Signed)
Records received from Dr. Melven Sartorius office. MRI results with significant enhancement and edema @ L5-S1 without abscess formation c/w discitis/osteomyelitis. She has been followed closely and has had serial CRP/ESR's checked to help interpret for deeper infection. These were normal   Gram stain from yesterday's procedure without organisms - seems she was on Cipro up until 7/23.   Lab work on 01/07/18 show an abrupt rise in her CRP to 16 with normal ESR of 6 - CRP typically we see rise more acutely with ESR more delayed. WBC also slightly elevated at 11.4.   Will call [facility] when her cultures are final to change or provide reassurance in current plan.   Janene Madeira, MSN, NP-C [facility] [place] Office: [phone] Pager: [phone]  01/15/18 4:36 PM

## 2018-01-15 NOTE — Telephone Encounter (Signed)
I am sorry she is feeling anxious - If this is a chronic problem I would prefer to have her follow up with her primary care provider to help manage her medications. If she does not have someone to manage this I would be willing to send in some PRN Hydroxyzine if she cannot get an appointment soon. I will call her to discuss this and her culture results when they result back.   Thank you, Angelique Blonderenise.

## 2018-01-15 NOTE — Telephone Encounter (Signed)
Patient informed that the culture results are pending.  Patient asking for medication for Anxiety due to having PICC.  Pa stated that she has had problems with anxiety since she was in high school.  RN will route this message to S. Dixon, NP to address this issue.

## 2018-01-15 NOTE — Telephone Encounter (Signed)
Patient agreed to contact her family practitioner for anxiety medication.

## 2018-01-16 ENCOUNTER — Ambulatory Visit (HOSPITAL_COMMUNITY): Payer: BLUE CROSS/BLUE SHIELD

## 2018-01-16 ENCOUNTER — Encounter: Payer: Self-pay | Admitting: Family

## 2018-01-16 ENCOUNTER — Ambulatory Visit: Payer: BLUE CROSS/BLUE SHIELD | Admitting: Family

## 2018-01-16 ENCOUNTER — Telehealth: Payer: Self-pay | Admitting: Infectious Diseases

## 2018-01-16 ENCOUNTER — Encounter (HOSPITAL_COMMUNITY): Payer: Self-pay

## 2018-01-16 ENCOUNTER — Ambulatory Visit (HOSPITAL_COMMUNITY)
Admission: RE | Admit: 2018-01-16 | Discharge: 2018-01-16 | Disposition: A | Discharge status: Home / Self Care | Payer: BLUE CROSS/BLUE SHIELD | Source: Ambulatory Visit | Attending: Student | Admitting: Student

## 2018-01-16 VITALS — BP 98/63 | HR 88 | Temp 98.4°F | Ht 64.0 in | Wt 134.4 lb

## 2018-01-16 DIAGNOSIS — F411 Generalized anxiety disorder: Secondary | ICD-10-CM

## 2018-01-16 MED ORDER — BUSPIRONE HCL 10 MG PO TABS: 10.0000 mg | ORAL_TABLET | Freq: Three times a day (TID) | ORAL | 1 refills | Status: DC

## 2018-01-16 NOTE — Patient Instructions (Signed)

## 2018-01-16 NOTE — Progress Notes (Signed)
   Subjective:    Patient ID: Janet White, female    DOB: [birth year], 22 y.o.   MRN: [id number]  Chief Complaint  Patient presents with  . anxiety and depression   PT presents to the office today with anxiety. States she had lumbar surgery in April. After surgery she developed discitis. States she had a PICC line placed  Monday and is getting IV antibiotics every 8 hours for the next 8 weeks. She states she is having a hard time coping and getting use all of the changes.  Anxiety  Presents for initial visit. The problem has been gradually worsening. Symptoms include decreased concentration, excessive worry, insomnia, irritability, nervous/anxious behavior, panic and restlessness. Symptoms occur most days. The severity of symptoms is moderate.   Her past medical history is significant for anxiety/panic attacks.      Review of Systems  Constitutional: Positive for irritability.  Psychiatric/Behavioral: Positive for decreased concentration. The patient is nervous/anxious and has insomnia.   All other systems reviewed and are negative.      Objective:   Physical Exam  Constitutional: She is oriented to person, place, and time. She appears well-developed and well-nourished. No distress.  HENT:  Head: Normocephalic and atraumatic.  Right Ear: External ear normal.  Left Ear: External ear normal.  Mouth/Throat: Oropharynx is clear and moist.  Eyes: Pupils are equal, round, and reactive to light.  Neck: Normal range of motion. Neck supple. No thyromegaly present.  Cardiovascular: Normal rate, regular rhythm, normal heart sounds and intact distal pulses.  No murmur heard. Pulmonary/Chest: Effort normal and breath sounds normal. No respiratory distress. She has no wheezes.  Abdominal: Soft. Bowel sounds are normal. She exhibits no distension. There is no tenderness.  Musculoskeletal: Normal range of motion. She exhibits no edema or tenderness.  Neurological: She is alert and oriented to  person, place, and time. She has normal reflexes. No cranial nerve deficit.  Skin: Skin is warm and dry.  Psychiatric: She has a normal mood and affect. Her behavior is normal. Judgment and thought content normal.  Tearful and crying  Vitals reviewed.     BP 98/63   Pulse 88   Temp 98.4 F (36.9 C) (Oral)   Ht 5\' 4"  (1.626 m)   Wt 134 lb 6.4 oz (61 kg)   LMP 12/17/2017 (Approximate)   BMI 23.07 kg/m      Assessment & Plan:  .Janet White comes in today with chief complaint of anxiety and depression   Diagnosis and orders addressed:  1. GAD (generalized anxiety disorder) Pt does not want to start "antidepressant", but wants something short term Stress management  Follow up with PCP and keep all appts with surgeon  - busPIRone (BUSPAR) 10 MG tablet; Take 1 tablet (10 mg total) by mouth 3 (three) times daily.  Dispense: 90 tablet; Refill: 1   Jannifer Rodneyhristy Elson Ulbrich, FNP

## 2018-01-16 NOTE — Telephone Encounter (Signed)
Results of back culture reviewed - MSSA with intermediate resistance to ciprofloxacin. Discussed with Corrie DandyMary that she is on the perfect medication for this kind of infection and I would not make any changes at this point. She saw her primary care provider this morning to help with anxiety control. Reminded to call for follow up appointment in ~September 10th for 6 week check in. She also has an appointment with Dr. Venetia MaxonStern coming up soon.   Welcomed and answered all questions.   Rexene AlbertsStephanie Geronimo Diliberto, MSN, NP-C [facility] Office: [phone] Pager: [phone]  01/16/2018  2:07 PM

## 2018-01-17 DIAGNOSIS — T8149XA Infection following a procedure, other surgical site, initial encounter: Secondary | ICD-10-CM | POA: Diagnosis not present

## 2018-01-17 NOTE — Telephone Encounter (Signed)
Thank you :)

## 2018-01-19 LAB — AEROBIC/ANAEROBIC CULTURE W GRAM STAIN (SURGICAL/DEEP WOUND)

## 2018-01-19 LAB — AEROBIC/ANAEROBIC CULTURE (SURGICAL/DEEP WOUND)

## 2018-01-20 ENCOUNTER — Encounter: Payer: Self-pay | Admitting: Infectious Diseases

## 2018-01-20 DIAGNOSIS — Z792 Long term (current) use of antibiotics: Secondary | ICD-10-CM | POA: Diagnosis not present

## 2018-01-20 DIAGNOSIS — Z5181 Encounter for therapeutic drug level monitoring: Secondary | ICD-10-CM | POA: Diagnosis not present

## 2018-01-20 DIAGNOSIS — T8149XA Infection following a procedure, other surgical site, initial encounter: Secondary | ICD-10-CM | POA: Diagnosis not present

## 2018-01-22 DIAGNOSIS — M5127 Other intervertebral disc displacement, lumbosacral region: Secondary | ICD-10-CM | POA: Diagnosis not present

## 2018-01-22 DIAGNOSIS — M4647 Discitis, unspecified, lumbosacral region: Secondary | ICD-10-CM | POA: Diagnosis not present

## 2018-01-22 DIAGNOSIS — M4646 Discitis, unspecified, lumbar region: Secondary | ICD-10-CM | POA: Diagnosis not present

## 2018-01-22 DIAGNOSIS — A4101 Sepsis due to Methicillin susceptible Staphylococcus aureus: Secondary | ICD-10-CM | POA: Diagnosis not present

## 2018-01-22 DIAGNOSIS — M545 Low back pain: Secondary | ICD-10-CM | POA: Diagnosis not present

## 2018-01-22 DIAGNOSIS — M5416 Radiculopathy, lumbar region: Secondary | ICD-10-CM | POA: Diagnosis not present

## 2018-01-23 ENCOUNTER — Telehealth: Payer: Self-pay | Admitting: *Deleted

## 2018-01-23 MED ORDER — BUSPIRONE HCL 15 MG PO TABS: 15.0000 mg | ORAL_TABLET | Freq: Three times a day (TID) | ORAL | 1 refills | Status: DC

## 2018-01-23 NOTE — Telephone Encounter (Signed)
Patient aware of change in medicine.  She says to let the provider know " she thanks her"!

## 2018-01-23 NOTE — Telephone Encounter (Signed)
Buspar is not working for anxiety Anxiety is worsening Please advise

## 2018-01-23 NOTE — Telephone Encounter (Signed)
Buspar increased to 15 mg TID. Prescription sent to pharmacy

## 2018-01-27 ENCOUNTER — Other Ambulatory Visit (HOSPITAL_COMMUNITY)
Admission: RE | Admit: 2018-01-27 | Discharge: 2018-01-27 | Disposition: A | Discharge status: Home / Self Care | Payer: BLUE CROSS/BLUE SHIELD | Source: Ambulatory Visit | Attending: Infectious Diseases | Admitting: Infectious Diseases

## 2018-01-27 DIAGNOSIS — R7989 Other specified abnormal findings of blood chemistry: Secondary | ICD-10-CM | POA: Insufficient documentation

## 2018-01-27 DIAGNOSIS — T8149XA Infection following a procedure, other surgical site, initial encounter: Secondary | ICD-10-CM | POA: Diagnosis not present

## 2018-01-27 DIAGNOSIS — R7982 Elevated C-reactive protein (CRP): Secondary | ICD-10-CM | POA: Insufficient documentation

## 2018-01-27 DIAGNOSIS — A419 Sepsis, unspecified organism: Secondary | ICD-10-CM | POA: Insufficient documentation

## 2018-01-27 LAB — CBC WITH DIFFERENTIAL/PLATELET
Basophils Absolute: 0 10*3/uL (ref 0.0–0.1)
Basophils Relative: 0 %
EOS ABS: 0.3 10*3/uL (ref 0.0–0.7)
Eosinophils Relative: 3 %
HCT: 37.7 % (ref 36.0–46.0)
HEMOGLOBIN: 12.1 g/dL (ref 12.0–15.0)
LYMPHS ABS: 1.6 10*3/uL (ref 0.7–4.0)
LYMPHS PCT: 20 %
MCH: 30.7 pg (ref 26.0–34.0)
MCHC: 32.1 g/dL (ref 30.0–36.0)
MCV: 95.7 fL (ref 78.0–100.0)
Monocytes Absolute: 0.6 10*3/uL (ref 0.1–1.0)
Monocytes Relative: 7 %
NEUTROS ABS: 5.7 10*3/uL (ref 1.7–7.7)
NEUTROS PCT: 70 %
Platelets: 367 10*3/uL (ref 150–400)
RBC: 3.94 MIL/uL (ref 3.87–5.11)
RDW: 13.4 % (ref 11.5–15.5)
WBC: 8.2 10*3/uL (ref 4.0–10.5)

## 2018-01-27 LAB — BASIC METABOLIC PANEL
ANION GAP: 8 (ref 5–15)
BUN: 13 mg/dL (ref 6–20)
CO2: 27 mmol/L (ref 22–32)
CREATININE: 0.56 mg/dL (ref 0.44–1.00)
Calcium: 9.3 mg/dL (ref 8.9–10.3)
Chloride: 105 mmol/L (ref 98–111)
GFR calc non Af Amer: >60 mL/min (ref >60)
Glucose, Bld: 85 mg/dL (ref 70–99)
POTASSIUM: 4.1 mmol/L (ref 3.5–5.1)
SODIUM: 140 mmol/L (ref 135–145)

## 2018-01-27 LAB — C-REACTIVE PROTEIN: CRP: <0.8 mg/dL (ref <1.0)

## 2018-01-28 ENCOUNTER — Telehealth: Payer: Self-pay

## 2018-01-28 NOTE — Telephone Encounter (Signed)
Per Rexene AlbertsStephanie Dixon, NP called patient to schedule an appointment with Dr. Luciana Axeomer or Judeth CornfieldStephanie. Scheduled appointment with Dr. Luciana Axeomer 9/10 at 9:30. Pt was informed of this appointment. Per Judeth CornfieldStephanie informed patient that labs are looking good and to f/u as planned. Lorenso CourierJose L Maldonado, [place]

## 2018-01-29 DIAGNOSIS — M4646 Discitis, unspecified, lumbar region: Secondary | ICD-10-CM | POA: Diagnosis not present

## 2018-01-29 DIAGNOSIS — A4101 Sepsis due to Methicillin susceptible Staphylococcus aureus: Secondary | ICD-10-CM | POA: Diagnosis not present

## 2018-01-29 DIAGNOSIS — T8149XA Infection following a procedure, other surgical site, initial encounter: Secondary | ICD-10-CM | POA: Diagnosis not present

## 2018-01-30 DIAGNOSIS — T8149XA Infection following a procedure, other surgical site, initial encounter: Secondary | ICD-10-CM | POA: Diagnosis not present

## 2018-02-03 ENCOUNTER — Encounter: Payer: Self-pay | Admitting: Internal Medicine

## 2018-02-03 DIAGNOSIS — T8149XA Infection following a procedure, other surgical site, initial encounter: Secondary | ICD-10-CM | POA: Diagnosis not present

## 2018-02-03 DIAGNOSIS — B9561 Methicillin susceptible Staphylococcus aureus infection as the cause of diseases classified elsewhere: Secondary | ICD-10-CM | POA: Diagnosis not present

## 2018-02-03 DIAGNOSIS — Z792 Long term (current) use of antibiotics: Secondary | ICD-10-CM | POA: Diagnosis not present

## 2018-02-03 DIAGNOSIS — Z5181 Encounter for therapeutic drug level monitoring: Secondary | ICD-10-CM | POA: Diagnosis not present

## 2018-02-06 ENCOUNTER — Other Ambulatory Visit: Payer: Self-pay | Admitting: Family

## 2018-02-10 DIAGNOSIS — B9561 Methicillin susceptible Staphylococcus aureus infection as the cause of diseases classified elsewhere: Secondary | ICD-10-CM | POA: Diagnosis not present

## 2018-02-10 DIAGNOSIS — T8149XA Infection following a procedure, other surgical site, initial encounter: Secondary | ICD-10-CM | POA: Diagnosis not present

## 2018-02-10 DIAGNOSIS — Z792 Long term (current) use of antibiotics: Secondary | ICD-10-CM | POA: Diagnosis not present

## 2018-02-10 DIAGNOSIS — Z5181 Encounter for therapeutic drug level monitoring: Secondary | ICD-10-CM | POA: Diagnosis not present

## 2018-02-11 MED ORDER — BUSPIRONE HCL 15 MG PO TABS: 15.0000 mg | ORAL_TABLET | Freq: Three times a day (TID) | ORAL | 0 refills | Status: DC

## 2018-02-11 NOTE — Telephone Encounter (Signed)
Rx failed to escribe, resent

## 2018-02-11 NOTE — Addendum Note (Signed)
Addended by: Julious PayerHOLT, Jillayne Witte D on: 02/11/2018 08:28 AM   Modules accepted: Orders

## 2018-02-12 ENCOUNTER — Telehealth: Payer: Self-pay

## 2018-02-12 DIAGNOSIS — T8149XA Infection following a procedure, other surgical site, initial encounter: Secondary | ICD-10-CM | POA: Diagnosis not present

## 2018-02-12 DIAGNOSIS — M4646 Discitis, unspecified, lumbar region: Secondary | ICD-10-CM | POA: Diagnosis not present

## 2018-02-12 DIAGNOSIS — A4101 Sepsis due to Methicillin susceptible Staphylococcus aureus: Secondary | ICD-10-CM | POA: Diagnosis not present

## 2018-02-12 NOTE — Telephone Encounter (Signed)
Patient called today requesting to speak with Rexene AlbertsStephanie Dixon, Np regarding some concerns with Picc line. Patient would also like for a referral for MRI. Will relay message to ColdstreamStephanie. Lorenso CourierJose L Maldonado, [place]

## 2018-02-12 NOTE — Telephone Encounter (Signed)
Attempted to call patient back but no answer. LVM to return call and to please call nurse triage tomorrow so I can speak with her about her concerns and need for MRI.

## 2018-02-13 ENCOUNTER — Telehealth: Payer: Self-pay | Admitting: Infectious Diseases

## 2018-02-14 DIAGNOSIS — T8149XA Infection following a procedure, other surgical site, initial encounter: Secondary | ICD-10-CM | POA: Diagnosis not present

## 2018-02-14 MED ORDER — FLUCONAZOLE 150 MG PO TABS: 150.0000 mg | ORAL_TABLET | ORAL | 0 refills | Status: DC

## 2018-02-14 NOTE — Telephone Encounter (Signed)
Patient wanted to report irritation to the tape surrounding picc line around her skin. She has a lot of problems with allergies/eczema so not surprising. Her home health team has implemented hypoallergenic dressing option today so she is hopeful that will help. Not involving at the insertion site and no drainage.   Her pain is getting better. No longer needing to take ibuprofen or PRN meds anymore.   She is hopeful that at the 6 week appointment we can consider possibly stopping IV therapy early since she is doing so much better and her counts have all normalized. I discussed with her today that she will be seeing Dr. Luciana White in a few weeks so they can consider at that time oral therapy with doxycycline x 14 more days if all are agreeable.   She is requesting something for yeast infection - tried OTC medications but not working. Will send in Diflucan 150 mg x 3 doses; instructed to take one and then repeat in 5-7days if needed.   Janet AlbertsStephanie Dixon, MSN, NP-C [facility]  [email or web address] Pager: [phone] Office: [phone]

## 2018-02-18 ENCOUNTER — Ambulatory Visit (INDEPENDENT_AMBULATORY_CARE_PROVIDER_SITE_OTHER): Payer: BLUE CROSS/BLUE SHIELD | Admitting: Internal Medicine

## 2018-02-18 ENCOUNTER — Encounter: Payer: Self-pay | Admitting: Internal Medicine

## 2018-02-18 DIAGNOSIS — M4646 Discitis, unspecified, lumbar region: Secondary | ICD-10-CM | POA: Diagnosis not present

## 2018-02-18 DIAGNOSIS — Z5181 Encounter for therapeutic drug level monitoring: Secondary | ICD-10-CM | POA: Diagnosis not present

## 2018-02-18 DIAGNOSIS — Z452 Encounter for adjustment and management of vascular access device: Secondary | ICD-10-CM

## 2018-02-18 MED ORDER — CEPHALEXIN 500 MG PO CAPS: 500.0000 mg | ORAL_CAPSULE | Freq: Four times a day (QID) | ORAL | 0 refills | Status: DC

## 2018-02-18 NOTE — Progress Notes (Signed)
Per verbal order from Dr Luciana Axe, 40 cm Single Lumen Peripherally Inserted Central Catheter removed from right basilic, tip intact. No sutures present. RN confirmed length per chart. Dressing was wet with yellow serous-like fluid, red/weepy skin under tegaderm dressing. PICC insertion site was clean.  Petroleum dressing applied. Pt advised no heavy lifting with this arm, leave dressing for at least 4 hours and call the office or seek emergent care if dressing becomes soaked with blood or swelling or sharp pain presents. Patient verbalized understanding and agreement.  Patient's questions answered to their satisfaction. Patient tolerated procedure well. RN notified Aggie Cosier and [facility] pharmacy. Andree Coss, RN

## 2018-02-18 NOTE — Assessment & Plan Note (Signed)
picc line area irritated and removed today Will let home health know

## 2018-02-18 NOTE — Assessment & Plan Note (Signed)
Labs reassuring, incision closed, clinically improved.  I will stop the cefazolin and use keflex 500 mg 4 times per day for 4 weeks rtc 3 weeks and will repeat CRP, ESR

## 2018-02-18 NOTE — Progress Notes (Signed)
   Subjective:    Patient ID: Janet White, female    DOB: Jul 26, 1995, 22 y.o.   MRN: [id number]  HPI Here for a work in visit for discitis/osteomyelitis.   She underwent a laminectomy by Dr. Vertell Limber in April 2019 due to pain and in May developed some drainage and grew MSSA superficialy and given Keflex.  She was then switched to cipro for 4 weeks and incision closed but pain worsened.  She underwent aspiration by IR and grew MSSA.  She was placed on cefazolin for a projected 6 weeks through September 13.  She comes in today though for a work in visit due to skin irritation and concern of pus around the picc line, though not out of the picc.  Her most recent labs are reassuring including now normal CRP and normal ESR.  Here culture was of MSSA.  Incision remains closed.  Pain is much improved, she says about 20% of the time she has pain compared to 90% previously.     Review of Systems  Constitutional: Positive for unexpected weight change. Negative for fatigue.       She has had some gradual weight loss since last year, though eats well.    Gastrointestinal: Positive for diarrhea. Negative for nausea.       She has persistent loose stools that were present prior to antibiotic treatment  Skin: Negative for rash.       Objective:   Physical Exam  Constitutional: She appears well-developed and well-nourished. No distress.  HENT:  Mouth/Throat: No oropharyngeal exudate.  Cardiovascular: Normal rate, regular rhythm and normal heart sounds.  No murmur heard. Pulmonary/Chest: Effort normal and breath sounds normal. No respiratory distress.  Musculoskeletal:  picc line with no pus, areas of skin irritation with erythema under bandage  Incision site close, no surrounding erythema, non tender   SH: no tobacco       Assessment & Plan:

## 2018-02-18 NOTE — Assessment & Plan Note (Signed)
Creat, WBC wnl last check

## 2018-02-25 ENCOUNTER — Ambulatory Visit: Payer: BLUE CROSS/BLUE SHIELD | Admitting: Internal Medicine

## 2018-03-05 ENCOUNTER — Telehealth: Payer: Self-pay | Admitting: *Deleted

## 2018-03-05 NOTE — Telephone Encounter (Signed)
Patient called. She is taking keflex every 6 hours "like clockwork" since her PICC was removed 9/3, but states the pain has returned "like before". She called Dr Fredrich BirksStern's office, was told to call [facility].  RN offered appointment with Dr Luciana Axeomer 9/19, patient accepted. Andree CossHowell, Katilynn Sinkler M, RN

## 2018-03-06 ENCOUNTER — Ambulatory Visit (INDEPENDENT_AMBULATORY_CARE_PROVIDER_SITE_OTHER): Payer: BLUE CROSS/BLUE SHIELD | Admitting: Internal Medicine

## 2018-03-06 ENCOUNTER — Encounter: Payer: Self-pay | Admitting: Internal Medicine

## 2018-03-06 VITALS — BP 120/74 | HR 74 | Temp 98.3°F | Ht 64.0 in | Wt 133.0 lb

## 2018-03-06 DIAGNOSIS — Z5181 Encounter for therapeutic drug level monitoring: Secondary | ICD-10-CM | POA: Diagnosis not present

## 2018-03-06 DIAGNOSIS — M4646 Discitis, unspecified, lumbar region: Secondary | ICD-10-CM | POA: Diagnosis not present

## 2018-03-06 NOTE — Assessment & Plan Note (Signed)
I will check bmp prior to MRI

## 2018-03-06 NOTE — Progress Notes (Signed)
   Subjective:    Patient ID: Janet White, female    DOB: [birth year], 23 y.o.   MRN: [id number]  HPI Here for a work in visit for increased back pain She underwent a laminectomy by Dr. Vertell Limber in April 2019 due to pain and in May developed some drainage and grew MSSA superficialy and given Keflex.  She was then switched to cipro for 4 weeks and incision closed but pain worsened.  She underwent aspiration by IR and grew MSSA.  MRI She was placed on cefazolin for a projected 6 weeks through September 13 but came in 9/3 with drainage from her PICC line.  Her back pain was much improved and CRP, ESR reassuring at the time.  She comes in now with worsening pain of her back that has progressed since I last saw her.  She has noted pain mainly on the left side over the left flank, no pain at the site of the incision.  She continues on Keflex and not missing any doses.  No associated fever or chills.  Taking ibuprofen as needed for pain.     Review of Systems  Gastrointestinal: Negative for diarrhea and nausea.  Skin: Negative for rash.       Objective:   Physical Exam  Constitutional: She appears well-developed and well-nourished. No distress.  Eyes: No scleral icterus.  Cardiovascular: Normal rate, regular rhythm and normal heart sounds.  No murmur heard. Pulmonary/Chest: Effort normal and breath sounds normal. No respiratory distress.  Musculoskeletal:  Her lumbar area shows a closed incision with no tenderness, no surrounding erythema, no warmth No muscle spasm noted  Neurological:  Walking, no deficits   SH: + tobacco       Assessment & Plan:

## 2018-03-06 NOTE — Assessment & Plan Note (Signed)
Pain has worsened.  On exam and history I feel this is a low likelihood that infection is worsening by description of the area and no pain at the incision site.  I though will check labs and do a repeat of the MRI to be sure no worsening infection and see if the degeneration is worsening.   She will also discuss with Dr. Fredrich BirksStern's office about refills of muscle relaxant indicated.   She will follow up depending on the results.

## 2018-03-07 ENCOUNTER — Telehealth: Payer: Self-pay | Admitting: Behavioral Health

## 2018-03-07 LAB — BASIC METABOLIC PANEL
BUN: 10 mg/dL (ref 7–25)
CO2: 25 mmol/L (ref 20–32)
CREATININE: 0.67 mg/dL (ref 0.50–1.10)
Calcium: 9.4 mg/dL (ref 8.6–10.2)
Chloride: 105 mmol/L (ref 98–110)
GLUCOSE: 92 mg/dL (ref 65–99)
Potassium: 4.3 mmol/L (ref 3.5–5.3)
Sodium: 138 mmol/L (ref 135–146)

## 2018-03-07 LAB — C-REACTIVE PROTEIN: CRP: 4.4 mg/L (ref <8.0)

## 2018-03-07 LAB — SEDIMENTATION RATE: SED RATE: 2 mm/h (ref 0–20)

## 2018-03-07 NOTE — Telephone Encounter (Signed)
[facility], informed her per Dr. Luciana Axeomer that her lab work looks good.  Also informed her Dr. Luciana Axeomer had low suspicion that her back is infected.  He still wants her to have to MRI but follow up with Dr. Luciana Axeomer after the MRI is complete.  Kabrea verbalized understanding.  Corrie DandyMary stated she [facility] but her insurance company called and does not want her to have it at the hospital but at an imaging center.  Timmothy Soursshley Rehner is already aware and patient states she is awaiting [employer] to call her back with an actual answer. Made Corrie DandyMary an appointment for March 24, 2018 but will make appointment with Dr. Luciana Axeomer sooner if MRI is completed sooner. Angeline SlimAshley Adysen Raphael RN

## 2018-03-07 NOTE — Telephone Encounter (Signed)
-----   Message from Gardiner Barefootobert W Comer, MD sent at 03/07/2018 11:18 AM EDT ----- Please let her know that her labs look very good, low suspicion that her back is infected.  Have her follow up with me after the MRI results and cancel the appt on the 24th, unless the MRI gets done prior and can keep that. thanks

## 2018-03-11 ENCOUNTER — Ambulatory Visit: Payer: BLUE CROSS/BLUE SHIELD | Admitting: Internal Medicine

## 2018-03-14 ENCOUNTER — Ambulatory Visit (HOSPITAL_COMMUNITY): Admission: RE | Admit: 2018-03-14 | Payer: BLUE CROSS/BLUE SHIELD | Source: Ambulatory Visit

## 2018-03-14 DIAGNOSIS — R937 Abnormal findings on diagnostic imaging of other parts of musculoskeletal system: Secondary | ICD-10-CM | POA: Diagnosis not present

## 2018-03-14 DIAGNOSIS — Z8739 Personal history of other diseases of the musculoskeletal system and connective tissue: Secondary | ICD-10-CM | POA: Diagnosis not present

## 2018-03-14 DIAGNOSIS — M4646 Discitis, unspecified, lumbar region: Secondary | ICD-10-CM | POA: Diagnosis not present

## 2018-03-17 DIAGNOSIS — M545 Low back pain: Secondary | ICD-10-CM | POA: Diagnosis not present

## 2018-03-17 DIAGNOSIS — M5416 Radiculopathy, lumbar region: Secondary | ICD-10-CM | POA: Diagnosis not present

## 2018-03-17 DIAGNOSIS — M4647 Discitis, unspecified, lumbosacral region: Secondary | ICD-10-CM | POA: Diagnosis not present

## 2018-03-17 DIAGNOSIS — M5127 Other intervertebral disc displacement, lumbosacral region: Secondary | ICD-10-CM | POA: Diagnosis not present

## 2018-03-24 ENCOUNTER — Ambulatory Visit (INDEPENDENT_AMBULATORY_CARE_PROVIDER_SITE_OTHER): Payer: BLUE CROSS/BLUE SHIELD | Admitting: Internal Medicine

## 2018-03-24 ENCOUNTER — Encounter: Payer: Self-pay | Admitting: Internal Medicine

## 2018-03-24 DIAGNOSIS — M4646 Discitis, unspecified, lumbar region: Secondary | ICD-10-CM | POA: Diagnosis not present

## 2018-03-24 DIAGNOSIS — Z5181 Encounter for therapeutic drug level monitoring: Secondary | ICD-10-CM | POA: Diagnosis not present

## 2018-03-24 NOTE — Progress Notes (Signed)
   Subjective:    Patient ID: Janet White, female    DOB: 1996-06-09, 22 y.o.   MRN: [id number]  HPI She comes in for follow-up of infectious discitis. She came in about 3 weeks ago with concern for increased pain after completing the IV antibiotics.  No associated fever or chills.  She had continued with Keflex at that time but concerned that she needed to be back on IV medication.  I then did a repeat ESR which was 2, CRP 4.4, both wnl.  I did repeat an MRi which does show no worsening and reassuring results (in [place] for [facility]).  Here with her mother.     Review of Systems  Constitutional: Negative for fever.  Musculoskeletal:       Improved back pain and pain is intermittent.   Neurological: Negative for dizziness.       Objective:   Physical Exam  Constitutional: She appears well-developed and well-nourished. No distress.  Eyes: No scleral icterus.    SH: + tobacco      Assessment & Plan:

## 2018-03-24 NOTE — Assessment & Plan Note (Addendum)
No concerning signs with labs or MRI and at this time no further indication for antibiotics.  This was discussed with the patient and her mother.  They know to call back with new signs of increased, unremitting pain, tenderness or other concerns.   rtc PRN

## 2018-03-24 NOTE — Assessment & Plan Note (Signed)
Bmp last visit was wnl, no new concerns.

## 2018-03-25 ENCOUNTER — Ambulatory Visit: Payer: BLUE CROSS/BLUE SHIELD | Attending: Neurosurgery | Admitting: Physical Therapy

## 2018-03-25 ENCOUNTER — Encounter: Payer: Self-pay | Admitting: Physical Therapy

## 2018-03-25 ENCOUNTER — Other Ambulatory Visit: Payer: Self-pay

## 2018-03-25 DIAGNOSIS — M545 Low back pain: Secondary | ICD-10-CM | POA: Diagnosis not present

## 2018-03-25 DIAGNOSIS — G8929 Other chronic pain: Secondary | ICD-10-CM | POA: Insufficient documentation

## 2018-03-25 DIAGNOSIS — M6281 Muscle weakness (generalized): Secondary | ICD-10-CM

## 2018-03-25 NOTE — Therapy (Addendum)
[facility] [place], [place], [address] Phone: [phone]   Fax:  [phone]  Physical Therapy Evaluation/Discharge PHYSICAL THERAPY DISCHARGE SUMMARY  Visits from Start of Care: 1  Current functional level related to goals / functional outcomes: See below   Remaining deficits: Goals not met   Education / Equipment: HEP Plan: Patient agrees to discharge.  Patient goals were not met. Patient is being discharged due to not returning since the last visit.  ?????   Gabriela Eves, PT, DPT   Patient Details  Name: Janet White MRN: [id number] Date of Birth: 1995/07/23 Referring Provider (PT): Erline Levine, MD   Encounter Date: 03/25/2018  PT End of Session - 03/25/18 1430    Visit Number  1    Number of Visits  12    Date for PT Re-Evaluation  05/13/18    PT Start Time  1346    PT Stop Time  1428    PT Time Calculation (min)  42 min    Activity Tolerance  Patient tolerated treatment well    Behavior During Therapy  [facility] for tasks assessed/performed       Past Medical History:  Diagnosis Date  . Allergy   . Asthma   . 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 Eczema     Past Surgical History:  Procedure Laterality Date  . IR LUMBAR DISC ASPIRATION W/IMG GUIDE  01/14/2018    There were no vitals filed for this visit.   Subjective Assessment - 03/25/18 2147    Subjective  Patient arrives to physical therapy with reports of low back pain secondary to microdiscetomy and laminectomy on 10/08/2017. Patient reported after surgery, she developed an infection to which she was on antibiotics for 6 months. Patient reports difficulties with ADL, sleeping, and has not returned to recreational activities such as golf or gymnastics. Patient states she has difficulties with prolonged sitting and can only sit for 30 minutes before pain begins. Patient states standing and walking help alleviate pain. Pain at worst is 7/10 and  pain at best is 0/10. Patient's goals are to return to recreational activities, decrease pain, improve strength, improve ability to sitting time to greater than 1 hour, and have less difficulties with work and home activities.    Pertinent History  Asthma    Limitations  Sitting;House hold activities    How long can you sit comfortably?  30 minutes    How long can you stand comfortably?  unlimited    How long can you walk comfortably?  unlimited    Diagnostic tests  x-ray; MRI    Patient Stated Goals  no pain to return to recreational activities    Currently in Pain?  Yes    Pain Score  3     Pain Location  Back    Pain Orientation  Lower    Pain Descriptors / Indicators  Other (Comment)   "block"   Pain Type  Surgical pain    Pain Onset  More than a month ago    Pain Frequency  Intermittent    Aggravating Factors   sitting for prolonged time    Pain Relieving Factors  standing and laying down    Effect of Pain on Daily Activities  Difficulties with ADLS and recreational activities.         [facility] PT Assessment - 03/25/18 0001      Assessment   Medical Diagnosis  low back pain, unspecified laterality  Referring Provider (PT)  Erline Levine, MD    Onset Date/Surgical Date  10/08/17    Next MD Visit  January 2020      Precautions   Precautions  None      Restrictions   Weight Bearing Restrictions  No      Balance Screen   Has the patient fallen in the past 6 months  No    Has the patient had a decrease in activity level because of a fear of falling?   Yes    Is the patient reluctant to leave their home because of a fear of falling?   No      Home Film/video editor residence      Prior Function   Level of Independence  Independent    Vocation  Full time employment    Barista, bartender      Observation/Other Assessments   Skin Integrity  scar is mobile and flexible and well healed    Focus on Therapeutic Outcomes (FOTO)    56% limited      Sensation   Light Touch  Appears Intact      Posture/Postural Control   Posture/Postural Control  Postural limitations    Postural Limitations  Rounded Shoulders;Forward head;Decreased lumbar lordosis      ROM / Strength   AROM / PROM / Strength  Strength      Strength   Strength Assessment Site  Hip;Knee    Right/Left Hip  Right;Left    Right Hip Flexion  4+/5    Right Hip Extension  3+/5    Right Hip ABduction  3+/5    Left Hip Flexion  4+/5    Left Hip Extension  3+/5    Left Hip ABduction  4-/5    Right/Left Knee  Right;Left    Right Knee Flexion  4+/5    Right Knee Extension  5/5    Left Knee Flexion  4+/5    Left Knee Extension  5/5      Palpation   Palpation comment  minimal tenderness to palpation to lumbar parapinals and upper gluteals      Transfers   Transfers  Independent with all Transfers      Ambulation/Gait   Gait Pattern  Within Functional Limits                Objective measurements completed on examination: See above findings.              PT Education - 03/25/18 2044    Education Details  draw ins, bridges, supine marching     Methods  Explanation;Demonstration;Handout    Comprehension  Verbalized understanding;Returned demonstration          PT Long Term Goals - 03/25/18 2102      PT LONG TERM GOAL #1   Title  Patient will be independent with HEP    Time  6    Period  Weeks    Status  New      PT LONG TERM GOAL #2   Title  Patient will demonstrate 4+/5 or greater hip abduction and hip extension MMT bilaterally to improve stability during functional tasks.    Time  6    Period  Weeks    Status  New      PT LONG TERM GOAL #3   Title  Patient will demonstrate proper lifting mechanics to protect back during functional tasks.    Time  6    Period  Weeks    Status  New      PT LONG TERM GOAL #4   Title  Patient will report ability to perform ADLS independently and with less than 3/10 low back  pain.    Time  6    Period  Weeks    Status  New      PT LONG TERM GOAL #5   Title  Patient will report ability to return to recreational activities with less than 4/10 low back pain.     Time  6    Period  Weeks    Status  New             Plan - 03/25/18 2150    Clinical Impression Statement  Patient is a 22 year old female who presents to physical therapy with low back pain and decreased LE strength. Patient noted with decreased transverse abdominus strength as noted by the inability to maintain bilateral SLR for greater than 5 seconds. Patient noted with minimal tenderness to palpation to lumbar paraspinals and upper gluteals. Patient noted with forward head, rounded shoulders, and decreased lumbar lordosis in sitting and in standing. Patient denies any neurological symptoms or changes in sensation. Patient ambulates with a normal gait pattern. Patient would benefit from skilled physical therapy to address deficits and address patient's goals.     History and Personal Factors relevant to plan of care:  Asthma    Clinical Presentation  Evolving    Clinical Decision Making  Low    Rehab Potential  Good    PT Frequency  2x / week    PT Duration  6 weeks    PT Treatment/Interventions  ADLs/Self Care Home Management;Gait training;Stair training;Neuromuscular re-education;Passive range of motion;Manual techniques;Dry needling;Moist Heat;Cryotherapy;Electrical Stimulation;Therapeutic activities;Therapeutic exercise;Patient/family education;Vasopneumatic Device    PT Next Visit Plan  Assess HEP Bike or nustep, core stabilization exercises, LE strengthening, modalities PRN for pain relief.    PT Home Exercise Plan  see patient education section.    Consulted and Agree with Plan of Care  Patient       Patient will benefit from skilled therapeutic intervention in order to improve the following deficits and impairments:  Pain, Decreased activity tolerance, Decreased range of motion,  Decreased strength, Postural dysfunction  Visit Diagnosis: Chronic low back pain, unspecified back pain laterality, unspecified whether sciatica present - Plan: PT plan of care cert/re-cert  Muscle weakness (generalized) - Plan: PT plan of care cert/re-cert     Problem List Patient Active Problem List   Diagnosis Date Noted  . 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 Medication monitoring encounter 01/14/2018  . 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 Allergic urticaria 05/09/2015  . Eczema 12/12/2012    Gabriela Eves, PT, DPT  03/25/2018, 10:26 PM  [facility] [address] [place], [place], [address] Phone: [phone]   Fax:  [phone]  Name: Janet White MRN: [id number] Date of Birth: 1995-06-23

## 2018-04-01 ENCOUNTER — Ambulatory Visit: Payer: BLUE CROSS/BLUE SHIELD | Admitting: Physical Therapy

## 2018-04-03 ENCOUNTER — Ambulatory Visit: Payer: BLUE CROSS/BLUE SHIELD | Admitting: Physical Therapy

## 2018-05-20 DIAGNOSIS — Z8614 Personal history of Methicillin resistant Staphylococcus aureus infection: Secondary | ICD-10-CM | POA: Diagnosis not present

## 2018-05-20 DIAGNOSIS — L089 Local infection of the skin and subcutaneous tissue, unspecified: Secondary | ICD-10-CM | POA: Diagnosis not present

## 2018-05-20 DIAGNOSIS — L209 Atopic dermatitis, unspecified: Secondary | ICD-10-CM | POA: Diagnosis not present

## 2018-11-14 ENCOUNTER — Emergency Department (HOSPITAL_COMMUNITY)
Admission: EM | Admit: 2018-11-14 | Discharge: 2018-11-14 | Disposition: A | Discharge status: Home / Self Care | Payer: BLUE CROSS/BLUE SHIELD | Attending: Emergency Medicine | Admitting: Emergency Medicine

## 2018-11-14 ENCOUNTER — Other Ambulatory Visit: Payer: Self-pay

## 2018-11-14 ENCOUNTER — Emergency Department (HOSPITAL_COMMUNITY): Payer: BLUE CROSS/BLUE SHIELD

## 2018-11-14 ENCOUNTER — Encounter (HOSPITAL_COMMUNITY): Payer: Self-pay | Admitting: Emergency Medicine

## 2018-11-14 DIAGNOSIS — S12691A Other nondisplaced fracture of seventh cervical vertebra, initial encounter for closed fracture: Secondary | ICD-10-CM | POA: Insufficient documentation

## 2018-11-14 DIAGNOSIS — Y999 Unspecified external cause status: Secondary | ICD-10-CM | POA: Diagnosis not present

## 2018-11-14 DIAGNOSIS — R51 Headache: Secondary | ICD-10-CM | POA: Diagnosis not present

## 2018-11-14 DIAGNOSIS — J45909 Unspecified asthma, uncomplicated: Secondary | ICD-10-CM | POA: Diagnosis not present

## 2018-11-14 DIAGNOSIS — S0083XA Contusion of other part of head, initial encounter: Secondary | ICD-10-CM | POA: Diagnosis not present

## 2018-11-14 DIAGNOSIS — F17210 Nicotine dependence, cigarettes, uncomplicated: Secondary | ICD-10-CM | POA: Diagnosis not present

## 2018-11-14 DIAGNOSIS — S12600A Unspecified displaced fracture of seventh cervical vertebra, initial encounter for closed fracture: Secondary | ICD-10-CM | POA: Diagnosis not present

## 2018-11-14 DIAGNOSIS — Y9389 Activity, other specified: Secondary | ICD-10-CM | POA: Diagnosis not present

## 2018-11-14 DIAGNOSIS — Z91010 Allergy to peanuts: Secondary | ICD-10-CM | POA: Diagnosis not present

## 2018-11-14 DIAGNOSIS — Y92410 Unspecified street and highway as the place of occurrence of the external cause: Secondary | ICD-10-CM | POA: Insufficient documentation

## 2018-11-14 DIAGNOSIS — Z91040 Latex allergy status: Secondary | ICD-10-CM | POA: Insufficient documentation

## 2018-11-14 DIAGNOSIS — S12601A Unspecified nondisplaced fracture of seventh cervical vertebra, initial encounter for closed fracture: Secondary | ICD-10-CM | POA: Diagnosis not present

## 2018-11-14 DIAGNOSIS — Z79899 Other long term (current) drug therapy: Secondary | ICD-10-CM | POA: Insufficient documentation

## 2018-11-14 DIAGNOSIS — S42022A Displaced fracture of shaft of left clavicle, initial encounter for closed fracture: Secondary | ICD-10-CM

## 2018-11-14 DIAGNOSIS — S4992XA Unspecified injury of left shoulder and upper arm, initial encounter: Secondary | ICD-10-CM | POA: Diagnosis not present

## 2018-11-14 MED ORDER — FENTANYL CITRATE (PF) 100 MCG/2ML IJ SOLN
100.0000 ug | Freq: Once | INTRAMUSCULAR | Status: AC
  Administered 2018-11-14: 100 ug via INTRAVENOUS
  Filled 2018-11-14: qty 2
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
## 2018-11-14 NOTE — ED Notes (Signed)
Assisted PT with phone at bedside to contact family.

## 2018-11-14 NOTE — ED Notes (Signed)
Discharge instructions discussed with pt. Pt verbalized understanding. Pt to go home with mother
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
## 2018-11-14 NOTE — ED Notes (Signed)
Patient transported to X-ray 

## 2018-11-17 DIAGNOSIS — M25512 Pain in left shoulder: Secondary | ICD-10-CM | POA: Diagnosis not present

## 2018-11-21 DIAGNOSIS — S12691A Other nondisplaced fracture of seventh cervical vertebra, initial encounter for closed fracture: Secondary | ICD-10-CM | POA: Diagnosis not present

## 2018-11-21 DIAGNOSIS — S42025A Nondisplaced fracture of shaft of left clavicle, initial encounter for closed fracture: Secondary | ICD-10-CM | POA: Diagnosis not present

## 2018-11-21 DIAGNOSIS — M25512 Pain in left shoulder: Secondary | ICD-10-CM | POA: Diagnosis not present

## 2018-11-21 DIAGNOSIS — M542 Cervicalgia: Secondary | ICD-10-CM | POA: Diagnosis not present

## 2018-11-21 DIAGNOSIS — R030 Elevated blood-pressure reading, without diagnosis of hypertension: Secondary | ICD-10-CM | POA: Diagnosis not present

## 2018-12-24 DIAGNOSIS — M25512 Pain in left shoulder: Secondary | ICD-10-CM | POA: Diagnosis not present

## 2019-01-16 ENCOUNTER — Telehealth: Payer: Self-pay | Admitting: Nurse Practitioner

## 2019-01-16 NOTE — Telephone Encounter (Signed)
Will have to make appointment

## 2019-01-16 NOTE — Telephone Encounter (Signed)
Patient aware she will need to be seen  

## 2019-01-19 DIAGNOSIS — L209 Atopic dermatitis, unspecified: Secondary | ICD-10-CM | POA: Diagnosis not present

## 2019-01-19 DIAGNOSIS — L299 Pruritus, unspecified: Secondary | ICD-10-CM | POA: Diagnosis not present

## 2019-01-19 DIAGNOSIS — Z79899 Other long term (current) drug therapy: Secondary | ICD-10-CM | POA: Diagnosis not present

## 2019-01-19 DIAGNOSIS — L089 Local infection of the skin and subcutaneous tissue, unspecified: Secondary | ICD-10-CM | POA: Diagnosis not present

## 2019-03-05 DIAGNOSIS — L209 Atopic dermatitis, unspecified: Secondary | ICD-10-CM | POA: Diagnosis not present

## 2019-03-05 DIAGNOSIS — Z79899 Other long term (current) drug therapy: Secondary | ICD-10-CM | POA: Diagnosis not present

## 2019-03-05 DIAGNOSIS — L089 Local infection of the skin and subcutaneous tissue, unspecified: Secondary | ICD-10-CM | POA: Diagnosis not present

## 2019-03-05 DIAGNOSIS — Z8614 Personal history of Methicillin resistant Staphylococcus aureus infection: Secondary | ICD-10-CM | POA: Diagnosis not present

## 2019-05-06 IMAGING — MR MR LUMBAR SPINE WO/W CM
4 of 7 series · 18 of 48 positions shown · IV contrast (multihance)
Comparison: None.

CLINICAL DATA: Recent lower back surgery. New drainage from the
incision.

EXAM:
MRI LUMBAR SPINE WITHOUT AND WITH CONTRAST
TECHNIQUE: Multiplanar and multiecho pulse sequences of the lumbar spine were
obtained without and with intravenous contrast.
CONTRAST:  11mL MULTIHANCE GADOBENATE DIMEGLUMINE 529 MG/ML IV SOLN

[Series 2: T2 · sagittal · 4.0mm · 0.51mm/px · 5 of 14 slices shown (1 of 2)]
[im 1/14]
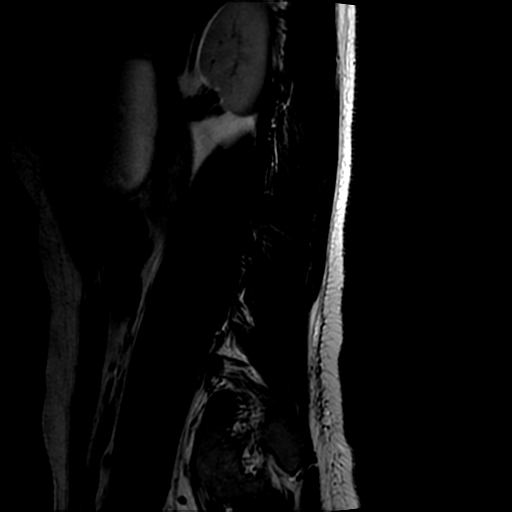
[im 4/14]
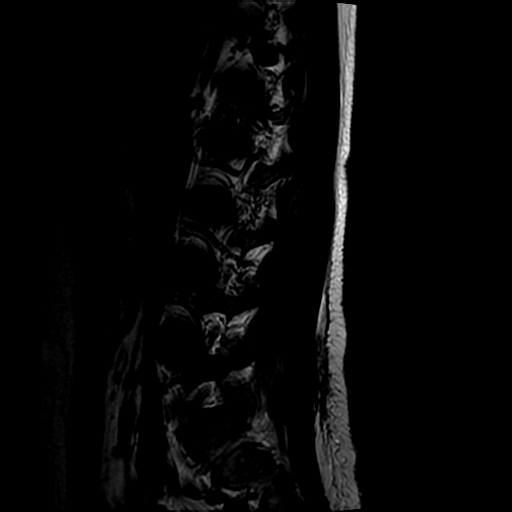
[im 7/14]
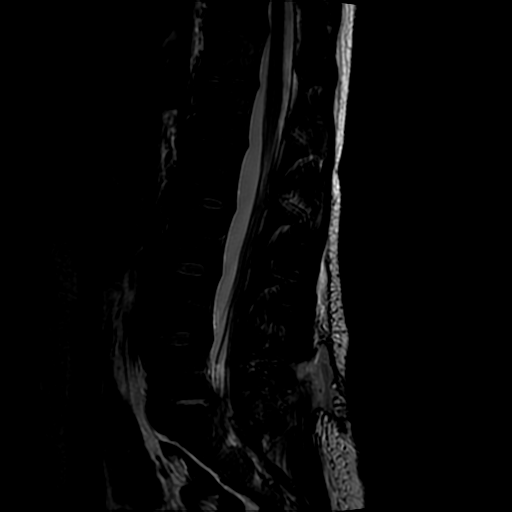
[im 10/14]
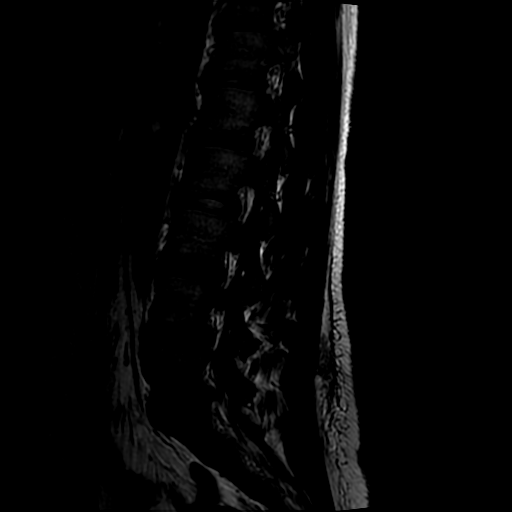
[im 14/14]
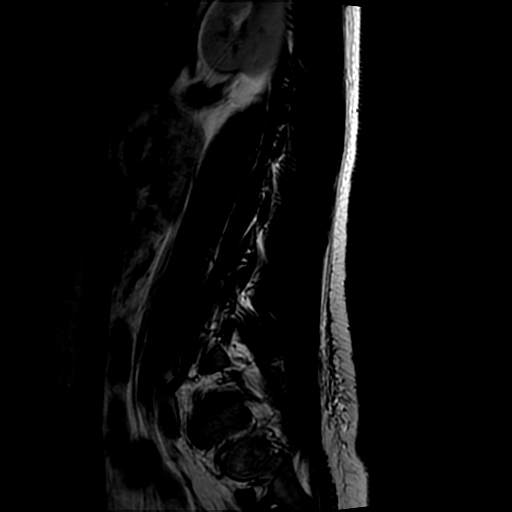

[Series 4: T1 · sagittal · 4.0mm · 0.51mm/px · 3 of 14 slices shown (1 of 2)]
[im 1/14]
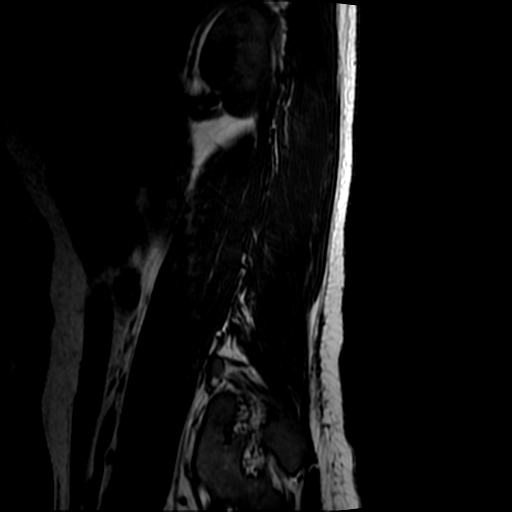
[im 9/14]
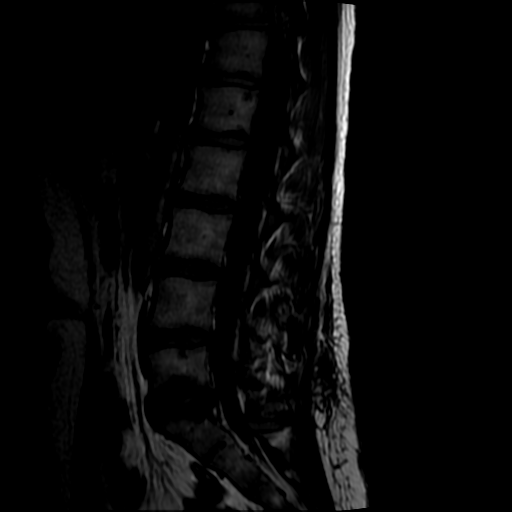
[im 14/14]
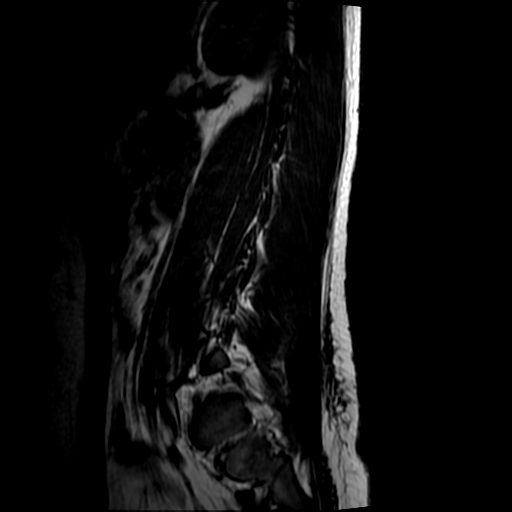

[Series 5: T2 · axial · 4.0mm · 0.39mm/px · z∈[-158,-5]mm · 7 of 34 slices shown (2 of 2)]
[im 1/34]
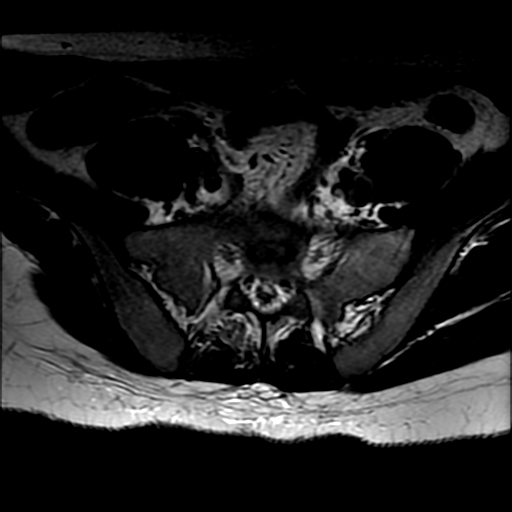
[im 4/34]
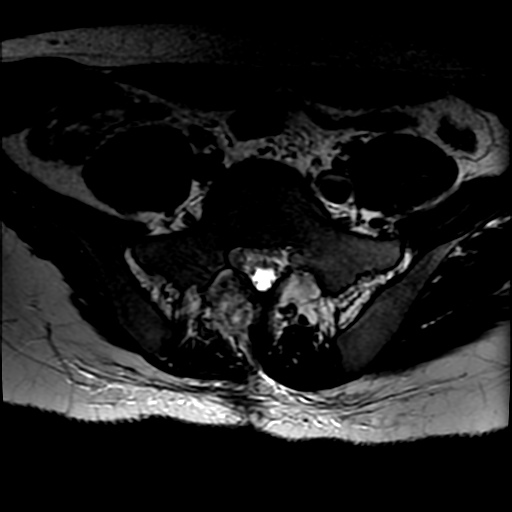
[im 12/34]
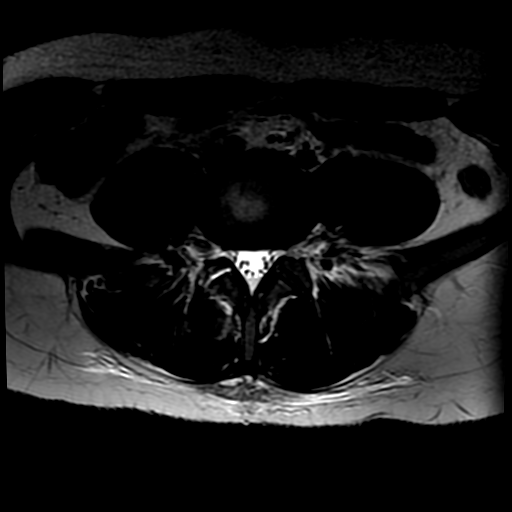
[im 15/34]
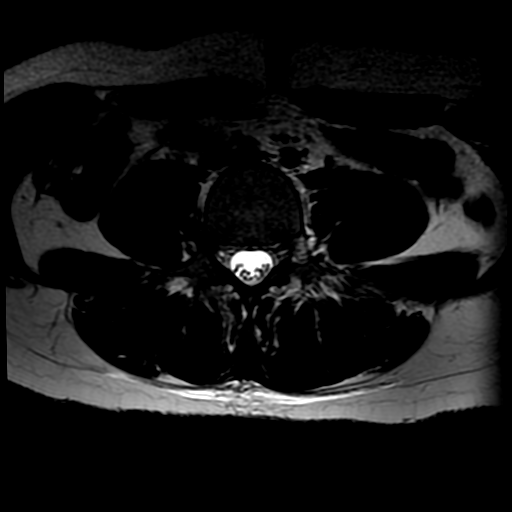
[im 19/34]
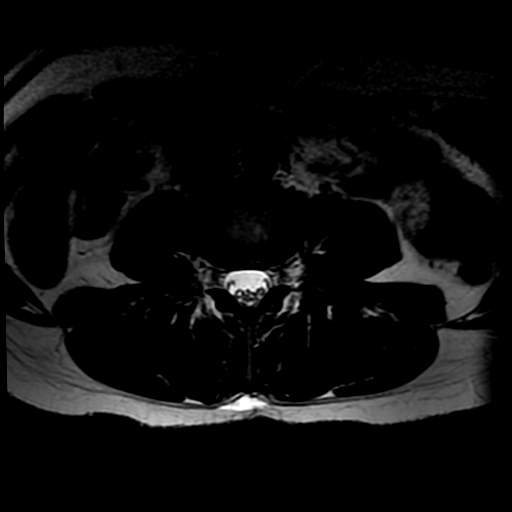
[im 23/34]
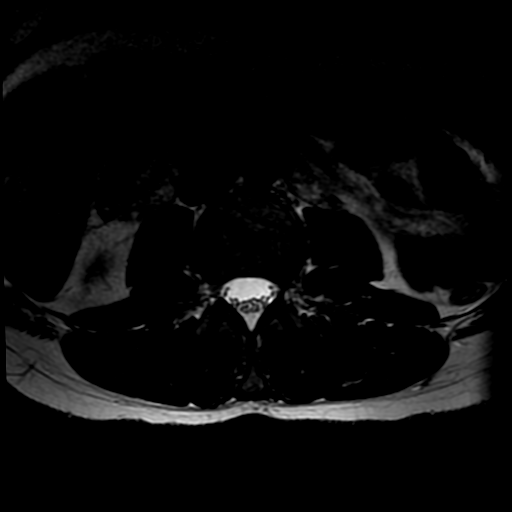
[im 30/34]
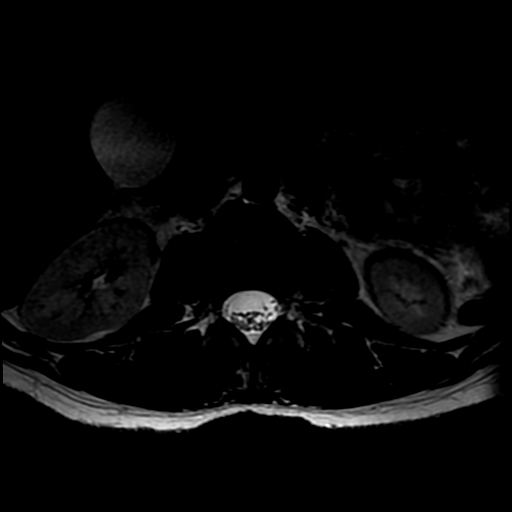

[Series 6: T1 · axial · 4.0mm · 0.39mm/px · z∈[-143,-5]mm · 3 of 34 slices shown (2 of 2)]
[im 4/34]
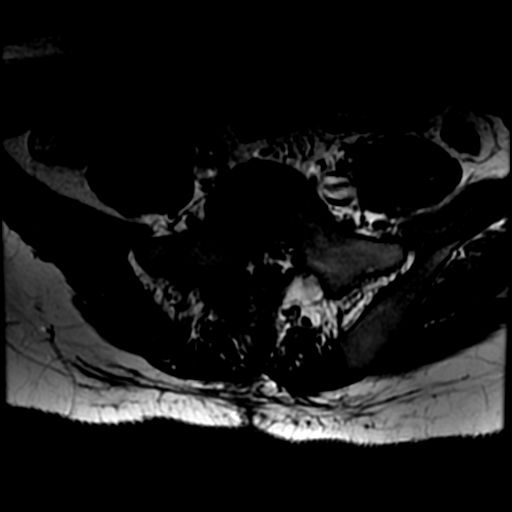
[im 19/34]
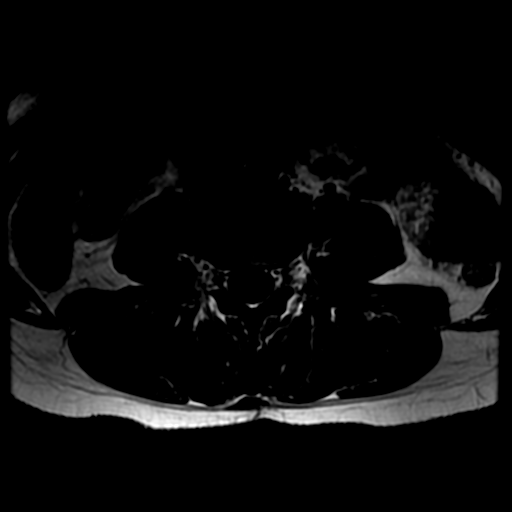
[im 30/34]
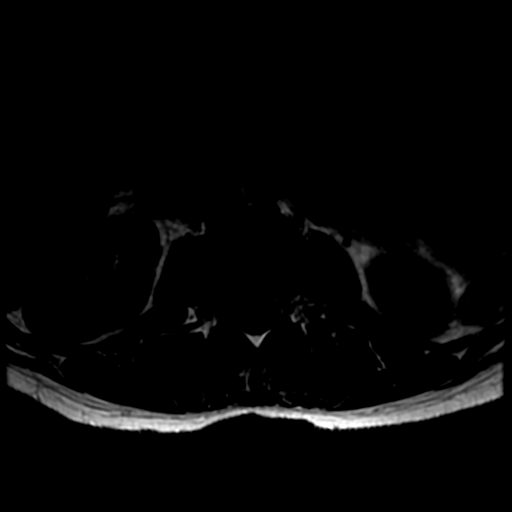

[18 of 48 positions shown; findings below may reference images not displayed]

FINDINGS: Segmentation: Assumed standard. The last well-formed disc space is
designated L5-S1 for the purposes of this report.

Alignment:  Physiologic.

Vertebrae: Mild endplate edema and enhancement at L5-S1. No
fracture, evidence of discitis, or focal bone lesion.

Conus medullaris and cauda equina: Conus extends to the L1-L2 level.
Conus and cauda equina appear normal. No abnormal enhancement.

Paraspinal and other soft tissues: In the midline superficial soft
tissues of the lower back overlying the L5 spinous process, there is
a 2.7 x 2.5 x 3.5 cm fluid collection extending to the skin.
Additional fluid in the right paraspinous soft tissues at this
level, extending into the right hemilaminectomy defect.

Disc levels:

T12-L1 to L3-L4:  No significant disc degeneration or stenosis.

L4-L5:  Trace disc bulge.  No stenosis.

L5-S1: Prior right hemilaminectomy. Fluid within the right aspect of
the disc. No disc enhancement. Small central disc protrusion. Mild
bilateral facet arthropathy. Enhancing granulation tissue
surrounding the descending right S1 nerve root. No spinal canal or
neuroforaminal stenosis.
IMPRESSION: 1. Postsurgical changes at L5-S1 from prior right hemilaminectomy
and partial discectomy. 3.5 cm fluid collection in the midline
superficial soft tissues of the lower back extending to the right L5
hemilaminectomy bed. This could represent a small seroma or abscess.
2. Fluid within the L5-S1 disc without disc enhancement. Mild edema
and enhancement of the L5-S1 endplates with preservation of the
cortex and no significant paravertebral inflammatory changes.
Findings are favored to reflect postsurgical change and mild
reactive edema, less likely osteomyelitis-discitis.
3. Small central disc protrusion at L5-S1. Enhancing granulation
tissue surrounding the descending right S1 nerve root.

## 2019-07-20 DIAGNOSIS — M545 Low back pain: Secondary | ICD-10-CM | POA: Diagnosis not present

## 2019-07-20 DIAGNOSIS — T148XXA Other injury of unspecified body region, initial encounter: Secondary | ICD-10-CM | POA: Diagnosis not present

## 2019-07-20 DIAGNOSIS — M5416 Radiculopathy, lumbar region: Secondary | ICD-10-CM | POA: Diagnosis not present

## 2019-07-20 IMAGING — XA IR DISC ASPIRATION W/IMAG GUIDE
4 series · 5 of 5 positions shown · non-contrast
Comparison: none

INDICATION: L5-S1 discitis

[Series 1: fl (-) angio · 1 of 1 slices shown (1 of 4)]
[im 1/1]
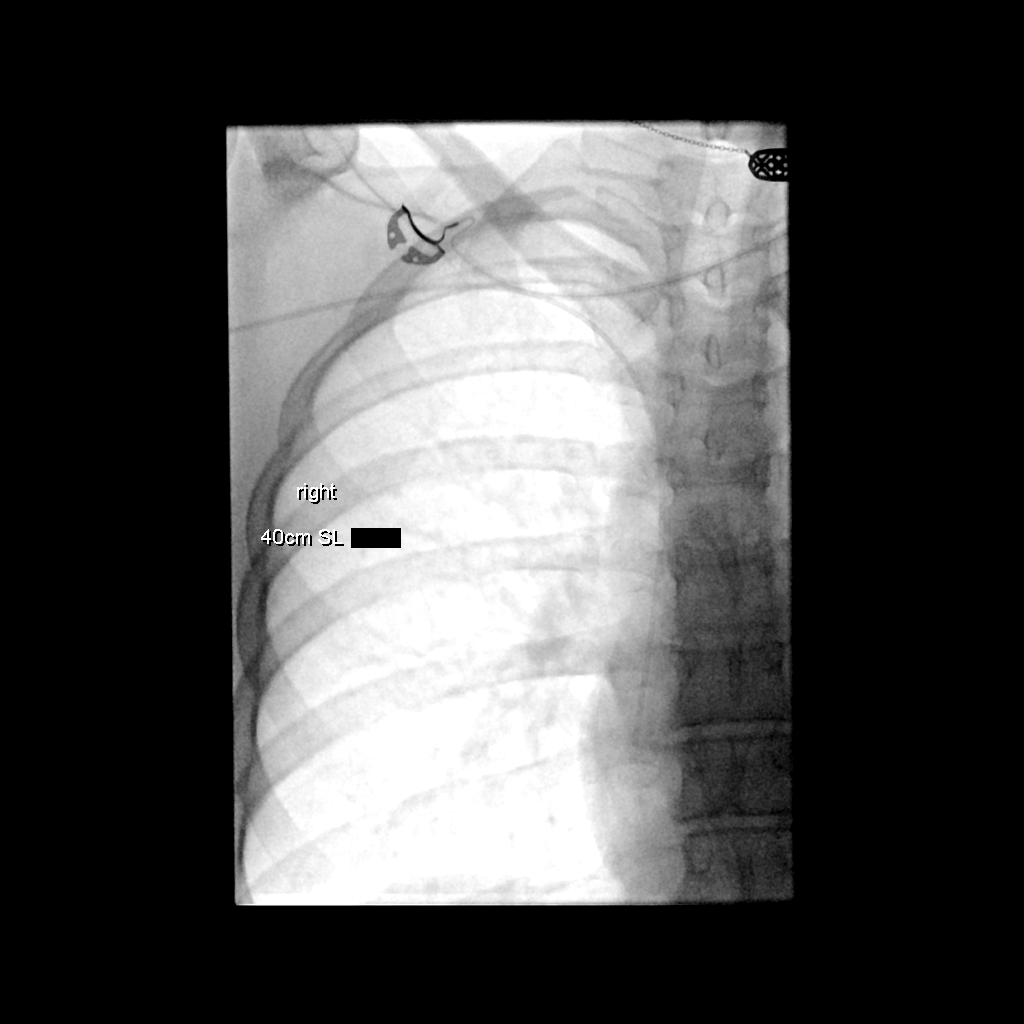

[Series 2: fl (-) angio · 1 of 1 slices shown (2 of 4)]
[im 1/1]
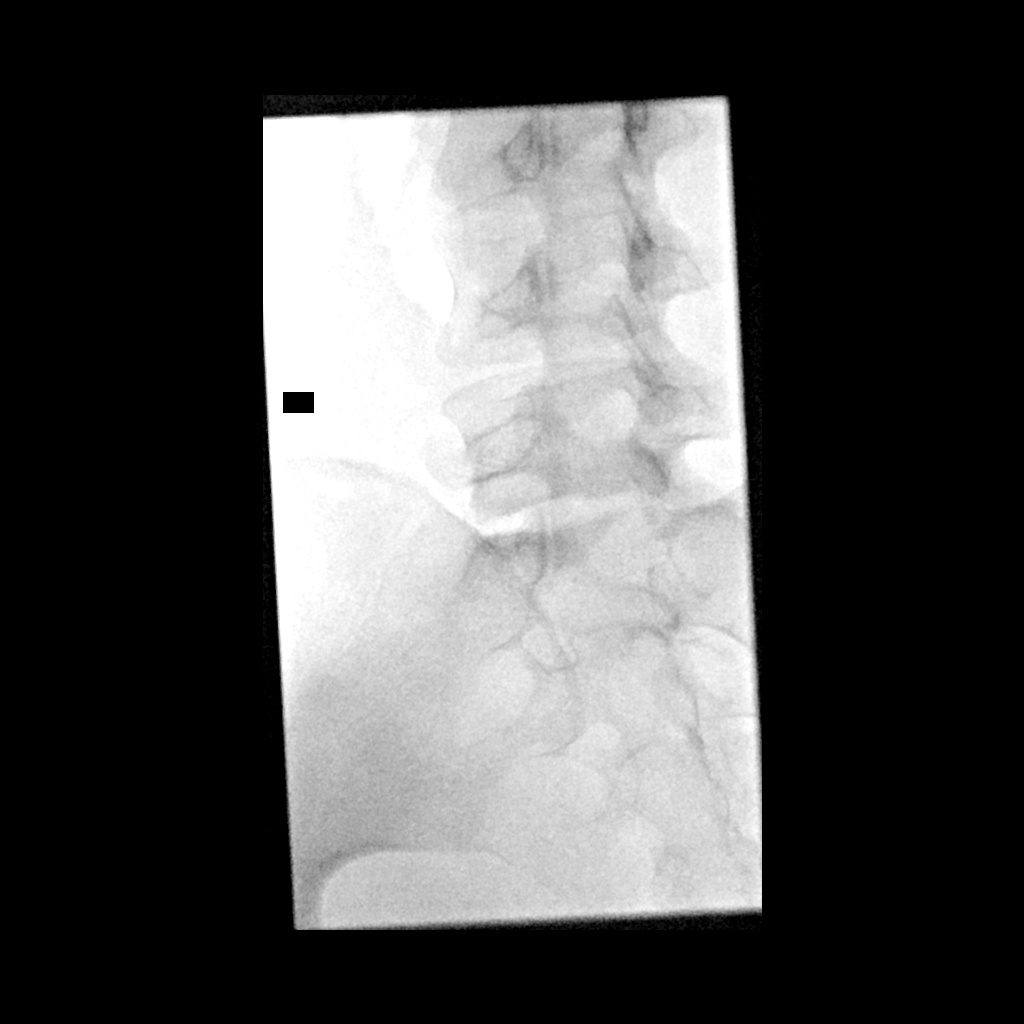

[Series 3: fl (-) angio · 1 of 1 slices shown (3 of 4)]
[im 1/1]
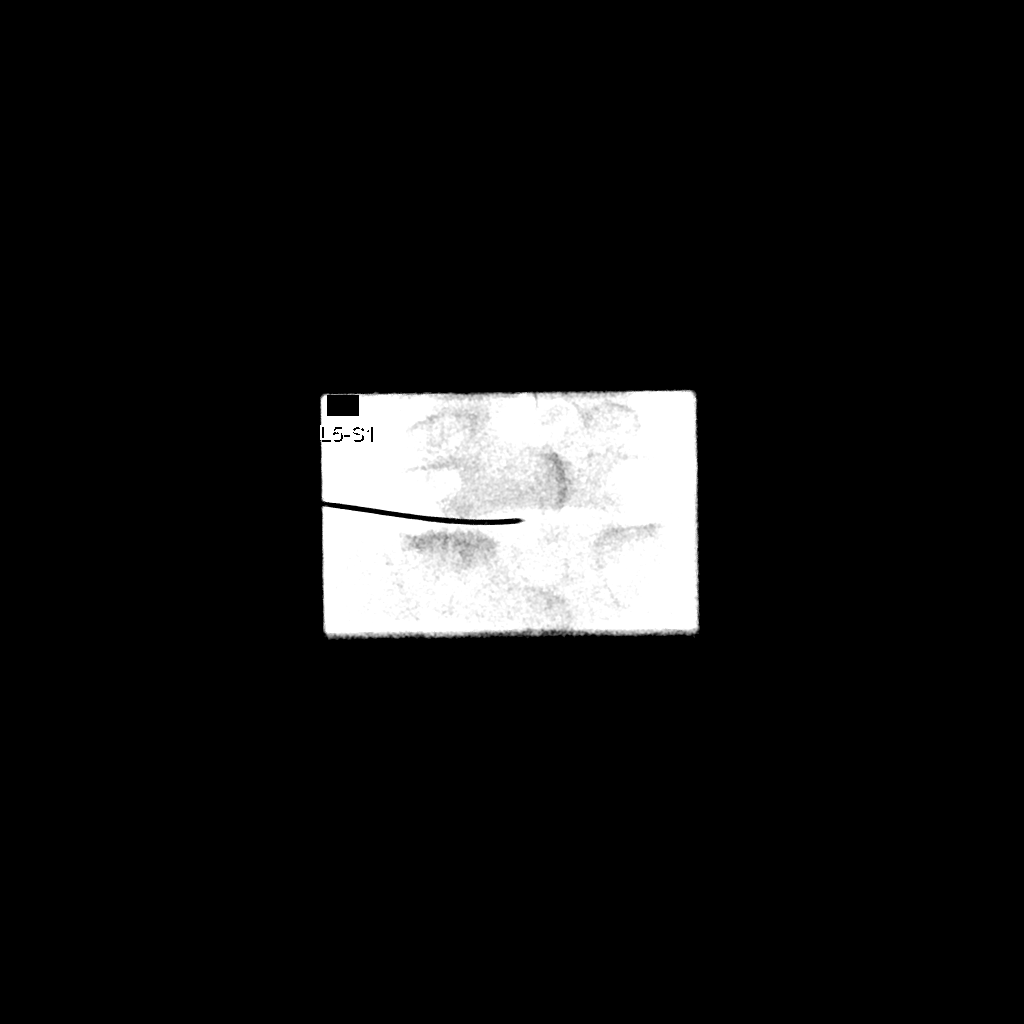

[Series 4: fl (-) angio · 2 of 2 slices shown (4 of 4)]
[im 1/2]
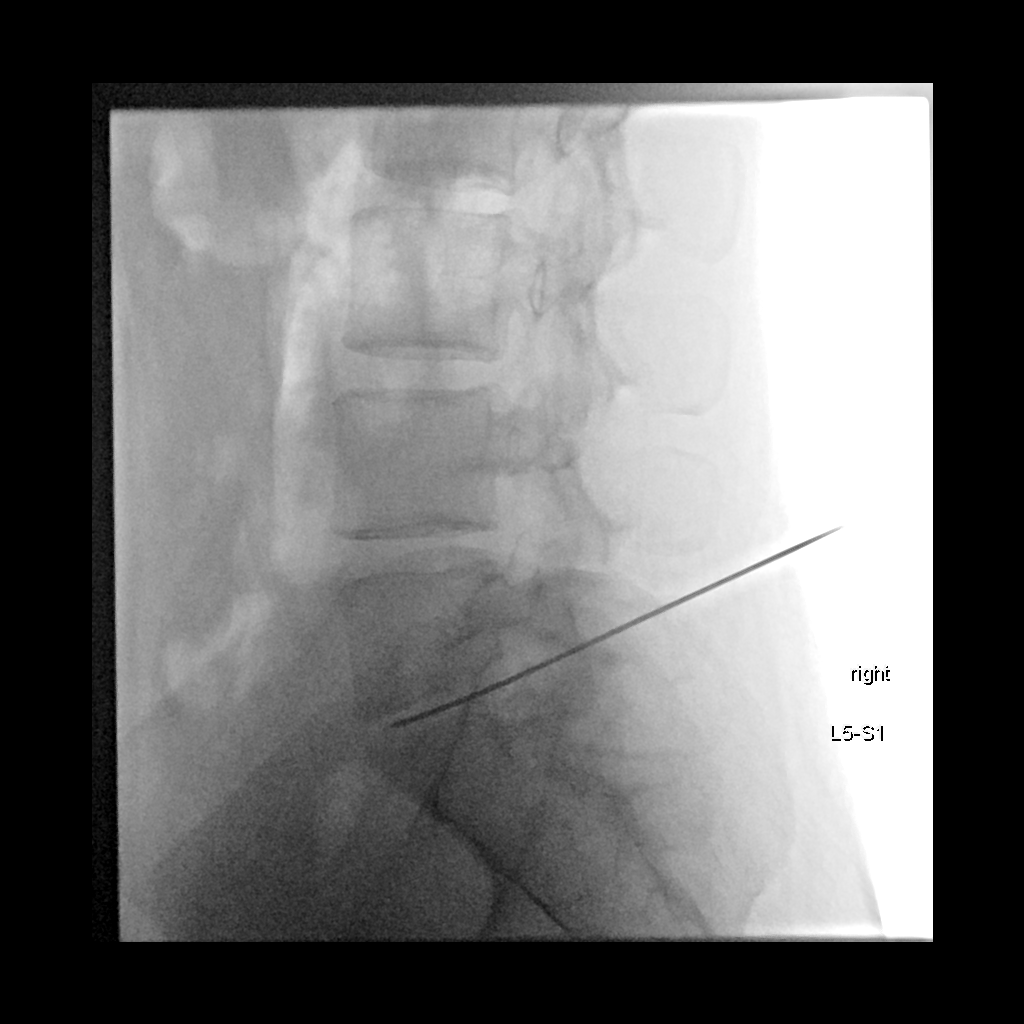
[im 2/2]
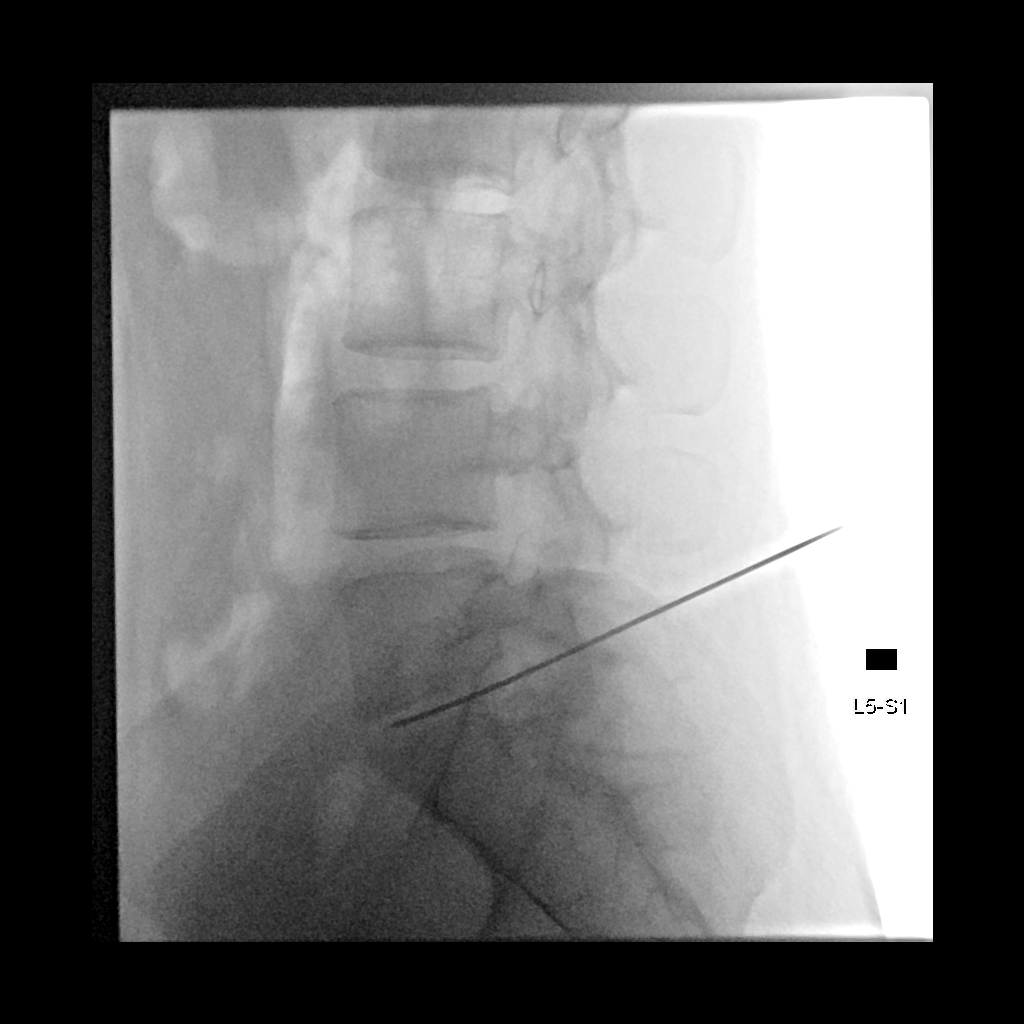

[5 of 5 positions shown; findings below may reference images not displayed]

EXAM:
L5-S1 DISC ASPIRATION

MEDICATIONS:
The patient is currently admitted to the hospital and receiving
intravenous antibiotics. The antibiotics were administered within an
appropriate time frame prior to the initiation of the procedure.

ANESTHESIA/SEDATION:
Fentanyl 100 mcg IV; Versed 2 mg IV

Moderate Sedation Time:  24 minutes

The patient was continuously monitored during the procedure by the
interventional radiology nurse under my direct supervision.

FLUOROSCOPY TIME:  1 minutes and 36 seconds.  Four mGy.

COMPLICATIONS:
None immediate.

PROCEDURE:
Informed written consent was obtained from the patient after a
thorough discussion of the procedural risks, benefits and
alternatives. All questions were addressed. Maximal Sterile Barrier
Technique was utilized including caps, mask, sterile gowns, sterile
gloves, sterile drape, hand hygiene and skin antiseptic. A timeout
was performed prior to the initiation of the procedure.

The back was prepped and draped in a sterile fashion. 1% lidocaine
was utilized for local anesthesia. Under fluoroscopic guidance, an
18 gauge needle was advanced into the L5-S1 disc space. Aspiration
yielded 2 drops of bloody fluid. This was sent for culture.
FINDINGS: Imaging confirms needle placement within the L5-S1 disc.
IMPRESSION: Successful L5-S1 disc aspiration.

## 2019-11-23 DIAGNOSIS — L089 Local infection of the skin and subcutaneous tissue, unspecified: Secondary | ICD-10-CM | POA: Diagnosis not present

## 2019-11-23 DIAGNOSIS — B079 Viral wart, unspecified: Secondary | ICD-10-CM | POA: Diagnosis not present

## 2019-11-23 DIAGNOSIS — L209 Atopic dermatitis, unspecified: Secondary | ICD-10-CM | POA: Diagnosis not present

## 2019-11-23 DIAGNOSIS — Z79899 Other long term (current) drug therapy: Secondary | ICD-10-CM | POA: Diagnosis not present

## 2020-01-04 DIAGNOSIS — L209 Atopic dermatitis, unspecified: Secondary | ICD-10-CM | POA: Diagnosis not present

## 2020-01-04 DIAGNOSIS — Z79899 Other long term (current) drug therapy: Secondary | ICD-10-CM | POA: Diagnosis not present

## 2020-01-04 DIAGNOSIS — Z8614 Personal history of Methicillin resistant Staphylococcus aureus infection: Secondary | ICD-10-CM | POA: Diagnosis not present

## 2020-02-26 ENCOUNTER — Encounter: Payer: Self-pay | Admitting: Allergy

## 2020-02-26 ENCOUNTER — Other Ambulatory Visit: Payer: Self-pay

## 2020-02-26 ENCOUNTER — Ambulatory Visit: Payer: BC Managed Care – PPO | Admitting: Allergy

## 2020-02-26 VITALS — BP 110/72 | HR 76 | Temp 98.1°F | Resp 16 | Ht 64.0 in | Wt 144.0 lb

## 2020-02-26 DIAGNOSIS — L2089 Other atopic dermatitis: Secondary | ICD-10-CM | POA: Diagnosis not present

## 2020-02-26 DIAGNOSIS — J3089 Other allergic rhinitis: Secondary | ICD-10-CM | POA: Diagnosis not present

## 2020-02-26 DIAGNOSIS — T7800XD Anaphylactic reaction due to unspecified food, subsequent encounter: Secondary | ICD-10-CM

## 2020-02-26 DIAGNOSIS — H1013 Acute atopic conjunctivitis, bilateral: Secondary | ICD-10-CM | POA: Diagnosis not present

## 2020-02-26 MED ORDER — EPINEPHRINE 0.3 MG/0.3ML IJ SOAJ: INTRAMUSCULAR | 3 refills | Status: DC

## 2020-02-26 NOTE — Patient Instructions (Addendum)
Eczema  - continue Dupixent injections every 2 weeks as directed by dermatology.            **Discussed today how Dupixent works in the allergy pathway and a main goal of Dupixent decreasing production of IgE, the allergy antibody.  With this allergy testing can be impacted due Dupixent and thus decision made to proceed with testing today to see if IgE can be detected from your allergens (foods and environmental)  - continue as needed use of triamcinolone for eczema flares as directed by dermatology  - continue daily moisturization with thick emollients especially after bathing   Food allergy  - continue avoidance of peanut, tree nuts, green pea and also would avoid soybean and possibly barley (see below).      - skin testing is positive to soybean, pecan, walnut, almond, hazelnut, pistachio and barley.   Negative to peanut, green pea, wheat, oat, rye, sesame, milk, egg, fish and shellfish  - will obtain IgE levels to peanut, green pea, soybean and barley to determine if you can perform in-office challenge to see if no longer allergic  - have access to self-injectable epinephrine (Epipen or AuviQ) 0.3mg  at all times  - follow emergency action plan in case of allergic reaction  - we have discussed the following in regards to foods:   Allergy: food allergy is when you have eaten a food, developed an allergic reaction after eating the food and have IgE to the food (positive food testing either by skin testing or blood testing).  Food allergy could lead to life threatening symptoms  Sensitivity: occurs when you have IgE to a food (positive food testing either by skin testing or blood testing) but is a food you eat without any issues.  This is not an allergy and we recommend keeping the food in the diet.   You may be sensitized to barley thus if you know you have been eating barley products without any symptoms after ingestion then you are sensitized and can keep in diet otherwise avoid in diet if having  symptoms following ingestion.    Intolerance: this is when you have negative testing by either skin testing or blood testing thus not allergic but the food causes symptoms (like belly pain, bloating, diarrhea etc) with ingestion.  These foods should be avoided to prevent symptoms.    Environmental allergies  - allergy testing today is positive to grass pollens, tree pollens, weed pollens, molds, dust mites, cat, cockroach, mouse.    - allergen avoidance measures discussed/handouts provided  - continue as needed use of Zyrtec 10mg  or Claritin 10mg   - if watery/itchy eyes can use over-the-counter Pataday or Pataday Xtra Strength 1 drop each eye daily as needed  - for nasal congestion/drainage can use over-the-counter nasal steroid spray like Flonase, Rhinocort or Nasacort 2 sprays each nostril daily for 1-2 weeks at a time.    Follow-up in 4-6 months or sooner if needed

## 2020-02-26 NOTE — Progress Notes (Signed)
New Patient Note  RE: Janet White MRN: [id number] DOB: 06/25/1995 Date of Office Visit: 02/26/2020  Referring provider: Bennie Pierini, * Primary care provider: Bennie Pierini, FNP  Chief Complaint: allergy testing  History of present illness: Janet White is a 24 y.o. female presenting today for consultation for food allergy, allergic rhinitis and eczema.   She reports a history of food allergy to peanuts, tree nuts and green peas.  She states her mother told her in high school she ate something that was cooked in peanut oil and she had difficulty breathing but she was also sick at that time.  She had testing at that time (about 10 years ago now) for food allergy and was positive to peanut, tree nuts and green pea.  Thus she has been avoiding these foods since.  She would love to eat Reese's cup if she is no longer allergic. She has never needed to use EpiPen device; her current one is expired.  She reports with gluten products she may have bloating or develop a red rash after eating.  She states she can develop rash with other foods not unsure which foods.   She does report environmental allergy symptoms are year-round at this time.  She reports lots of sneezing, watery eyes, puffy eyes, nasal congestion and drainage.  She states her testing was very positive to most allergens 10 years ago.  She states the Dupixent has helped with her allergies the most.  During change of seasons is when she will usually need to take zyrtec or claritin.   She sees dermatology for eczema and was started on dupixent about 3 years ago.  She states dupixent has maintained her eczema. She still needs to use topical therapy (triamcinolone).  She does have a history of getting staph infections secondary to eczema.    She used to have asthma as a child but has outgrown.  She has not used an inhaler since high school.    Review of systems: Review of Systems  Constitutional: Negative.   HENT:  Negative.   Eyes: Negative.   Respiratory: Negative.   Cardiovascular: Negative.   Gastrointestinal: Negative.   Musculoskeletal: Negative.   Skin:       See HPI  Neurological: Negative.     All other systems negative unless noted above in HPI  Past medical history: Past Medical History:  Diagnosis Date  . Allergy   . Asthma   . 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 Eczema     Past surgical history: Past Surgical History:  Procedure Laterality Date  . IR LUMBAR DISC ASPIRATION W/IMG GUIDE  01/14/2018    Family history:  Family History  Problem Relation Age of Onset  . Cancer Mother        breast  . Cancer Maternal Grandmother        breast ca  . Allergic rhinitis Father   . Angioedema Neg Hx   . Asthma Neg Hx   . Atopy Neg Hx   . Eczema Neg Hx   . Immunodeficiency Neg Hx   . Urticaria Neg Hx     Social history: Lives in a home without carpeting with gas heating and central cooling.  No pets in the home.  Dog outside the home.  No concern for water damage, mildew or roaches in the home. She is a [employer].  Denies a smoking history.   Medication List: Current Outpatient Medications  Medication Sig Dispense Refill  . Dupilumab (DUPIXENT)  300 MG/2ML SOSY Inject 300 mg into the skin See admin instructions. Every 15 days     . triamcinolone ointment (KENALOG) 0.1 % Apply topically.    Marland Kitchen EPINEPHrine (EPI-PEN) 0.3 mg/0.3 mL SOAJ Inject 0.3 mLs (0.3 mg total) into the muscle once. (Patient not taking: Reported on 02/26/2020) 1 Device 11   No current facility-administered medications for this visit.    Known medication allergies: Allergies  Allergen Reactions  . Latex Rash  . Peanuts [Peanut Oil]     All nuts   . Sulfa Antibiotics      Physical examination: Blood pressure 110/72, pulse 76, temperature 98.1 F (36.7 C), temperature source Temporal, resp. rate 16, height 5\' 4"  (1.626 m), weight 144 lb (65.3 kg), SpO2 99 %.  General: Alert,  interactive, in no acute distress. HEENT: PERRLA, TMs pearly gray, turbinates non-edematous without discharge, post-pharynx non erythematous. Neck: Supple without lymphadenopathy. Lungs: Clear to auscultation without wheezing, rhonchi or rales. {no increased work of breathing. CV: Normal S1, S2 without murmurs. Abdomen: Nondistended, nontender. Skin: Warm and dry, without lesions or rashes. Extremities:  No clubbing, cyanosis or edema. Neuro:   Grossly intact.  Diagnositics/Labs:  Allergy testing: environmental allergy testing is positive to grasses, weeds, trees, molds, both dust mites, cat, cockroach and mouse.  Select food allergy skin testing is positive to soybean, pecan, walnut, almond, hazelnut, pistachio, barley.  Allergy testing results were read and interpreted by provider, documented by clinical staff.   Assessment and plan:   Eczema  - continue Dupixent injections every 2 weeks as directed by dermatology.            **Discussed today how Dupixent works in the allergy pathway and a main goal of Dupixent decreasing production of IgE, the allergy antibody.  With this allergy testing can be impacted due Dupixent and thus decision made to proceed with testing today to see if IgE can be detected from your allergens (foods and environmental)  - continue as needed use of triamcinolone for eczema flares as directed by dermatology  - continue daily moisturization with thick emollients especially after bathing   Anaphylaxis due to food  - continue avoidance of peanut, tree nuts, green pea and also would avoid soybean and possibly barley (see below).      - skin testing is positive to soybean, pecan, walnut, almond, hazelnut, pistachio and barley.   Negative to peanut, green pea, wheat, oat, rye, sesame, milk, egg, fish and shellfish  - will obtain IgE levels to peanut, green pea, soybean and barley to determine if you can perform in-office challenge to see if no longer allergic  - have  access to self-injectable epinephrine (Epipen or AuviQ) 0.3mg  at all times  - follow emergency action plan in case of allergic reaction  - we have discussed the following in regards to foods:   Allergy: food allergy is when you have eaten a food, developed an allergic reaction after eating the food and have IgE to the food (positive food testing either by skin testing or blood testing).  Food allergy could lead to life threatening symptoms  Sensitivity: occurs when you have IgE to a food (positive food testing either by skin testing or blood testing) but is a food you eat without any issues.  This is not an allergy and we recommend keeping the food in the diet.   You may be sensitized to barley thus if you know you have been eating barley products without any symptoms after ingestion then  you are sensitized and can keep in diet otherwise avoid in diet if having symptoms following ingestion.    Intolerance: this is when you have negative testing by either skin testing or blood testing thus not allergic but the food causes symptoms (like belly pain, bloating, diarrhea etc) with ingestion.  These foods should be avoided to prevent symptoms.    Allergic rhinitis with conjunctivitis  - allergy testing today is positive to grass pollens, tree pollens, weed pollens, molds, dust mites, cat, cockroach, mouse.    - allergen avoidance measures discussed/handouts provided  - continue as needed use of Zyrtec 10mg  or Claritin 10mg   - if watery/itchy eyes can use over-the-counter Pataday or Pataday Xtra Strength 1 drop each eye daily as needed  - for nasal congestion/drainage can use over-the-counter nasal steroid spray like Flonase, Rhinocort or Nasacort 2 sprays each nostril daily for 1-2 weeks at a time.    Follow-up in 4-6 months or sooner if needed  I appreciate the opportunity to take part in [facility] care. Please do not hesitate to contact me with questions.  Sincerely,   ,  MD Allergy/Immunology [facility]

## 2020-02-29 LAB — ALLERGEN SOYBEAN: Soybean IgE: <0.1 kU/L

## 2020-02-29 LAB — ALLERGEN BARLEY F6: Allergen Barley IgE: 0.18 kU/L — AB

## 2020-02-29 LAB — IGE PEANUT COMPONENT PROFILE
F352-IgE Ara h 8: <0.1 kU/L
F422-IgE Ara h 1: <0.1 kU/L
F423-IgE Ara h 2: <0.1 kU/L
F424-IgE Ara h 3: <0.1 kU/L
F427-IgE Ara h 9: <0.1 kU/L
F447-IgE Ara h 6: <0.1 kU/L

## 2020-02-29 LAB — ALLERGEN PEA F12: Allergen Green Pea IgE: <0.1 kU/L

## 2020-03-28 DIAGNOSIS — L309 Dermatitis, unspecified: Secondary | ICD-10-CM | POA: Diagnosis not present

## 2020-03-30 ENCOUNTER — Encounter: Payer: BC Managed Care – PPO | Admitting: Family

## 2020-10-12 DIAGNOSIS — Z13220 Encounter for screening for lipoid disorders: Secondary | ICD-10-CM | POA: Diagnosis not present

## 2020-10-12 DIAGNOSIS — Z113 Encounter for screening for infections with a predominantly sexual mode of transmission: Secondary | ICD-10-CM | POA: Diagnosis not present

## 2020-10-12 DIAGNOSIS — Z124 Encounter for screening for malignant neoplasm of cervix: Secondary | ICD-10-CM | POA: Diagnosis not present

## 2020-10-12 DIAGNOSIS — Z01419 Encounter for gynecological examination (general) (routine) without abnormal findings: Secondary | ICD-10-CM | POA: Diagnosis not present

## 2020-10-12 DIAGNOSIS — Z0000 Encounter for general adult medical examination without abnormal findings: Secondary | ICD-10-CM | POA: Diagnosis not present

## 2020-11-17 ENCOUNTER — Ambulatory Visit: Payer: BLUE CROSS/BLUE SHIELD | Admitting: Nurse Practitioner

## 2020-11-17 ENCOUNTER — Other Ambulatory Visit: Payer: Self-pay

## 2021-01-18 DIAGNOSIS — L309 Dermatitis, unspecified: Secondary | ICD-10-CM | POA: Diagnosis not present

## 2021-01-30 DIAGNOSIS — L089 Local infection of the skin and subcutaneous tissue, unspecified: Secondary | ICD-10-CM | POA: Diagnosis not present

## 2021-01-30 DIAGNOSIS — Z79899 Other long term (current) drug therapy: Secondary | ICD-10-CM | POA: Diagnosis not present

## 2021-01-30 DIAGNOSIS — L209 Atopic dermatitis, unspecified: Secondary | ICD-10-CM | POA: Diagnosis not present

## 2021-09-07 ENCOUNTER — Ambulatory Visit (INDEPENDENT_AMBULATORY_CARE_PROVIDER_SITE_OTHER): Payer: BC Managed Care – PPO | Admitting: Nurse Practitioner

## 2021-09-07 ENCOUNTER — Encounter: Payer: Self-pay | Admitting: Nurse Practitioner

## 2021-09-07 VITALS — BP 113/78 | HR 57 | Temp 98.2°F | Resp 20 | Ht 64.0 in | Wt 147.0 lb

## 2021-09-07 DIAGNOSIS — L2084 Intrinsic (allergic) eczema: Secondary | ICD-10-CM | POA: Diagnosis not present

## 2021-09-07 DIAGNOSIS — L2083 Infantile (acute) (chronic) eczema: Secondary | ICD-10-CM

## 2021-09-07 MED ORDER — PREDNISONE 20 MG PO TABS: ORAL_TABLET | ORAL | 0 refills | Status: DC

## 2021-09-07 MED ORDER — METHYLPREDNISOLONE ACETATE 80 MG/ML IJ SUSP
80.0000 mg | Freq: Once | INTRAMUSCULAR | Status: AC
  Administered 2021-09-07: 80 mg via INTRAMUSCULAR

## 2021-09-07 NOTE — Progress Notes (Signed)
? ?  Subjective:  ? ? Patient ID: Janet White, female    DOB: 10-05-1995, 26 y.o.   MRN: [id number] ? ? ?Chief Complaint: Eczema flare up ? ? ?HPI ?Patient comes in with eczema flare up. She has had eczema since she was a child and has frequent  flare ups. She says her face has been peeling nonstop. ? ? ? ?Review of Systems  ?Constitutional:  Negative for diaphoresis.  ?Eyes:  Negative for pain.  ?Respiratory:  Negative for shortness of breath.   ?Cardiovascular:  Negative for chest pain, palpitations and leg swelling.  ?Gastrointestinal:  Negative for abdominal pain.  ?Endocrine: Negative for polydipsia.  ?Skin:  Negative for rash.  ?Neurological:  Negative for dizziness, weakness and headaches.  ?Hematological:  Does not bruise/bleed easily.  ?All other systems reviewed and are negative. ? ?   ?Objective:  ? Physical Exam ?Vitals and nursing note reviewed.  ?Constitutional:   ?   General: She is not in acute distress. ?   Appearance: Normal appearance. She is well-developed.  ?Neck:  ?   Vascular: No carotid bruit or JVD.  ?Cardiovascular:  ?   Rate and Rhythm: Normal rate and regular rhythm.  ?   Heart sounds: Normal heart sounds.  ?Pulmonary:  ?   Effort: Pulmonary effort is normal. No respiratory distress.  ?   Breath sounds: Normal breath sounds. No wheezing or rales.  ?Chest:  ?   Chest wall: No tenderness.  ?Abdominal:  ?   General: Bowel sounds are normal. There is no distension or abdominal bruit.  ?   Palpations: Abdomen is soft. There is no hepatomegaly, splenomegaly, mass or pulsatile mass.  ?   Tenderness: There is no abdominal tenderness.  ?Musculoskeletal:     ?   General: Normal range of motion.  ?   Cervical back: Normal range of motion and neck supple.  ?Lymphadenopathy:  ?   Cervical: No cervical adenopathy.  ?Skin: ?   General: Skin is warm and dry.  ?   Comments: Face is erythematous and dry  ?Neurological:  ?   Mental Status: She is alert and oriented to person, place, and time.  ?   Deep Tendon  Reflexes: Reflexes are normal and symmetric.  ?Psychiatric:     ?   Behavior: Behavior normal.     ?   Thought Content: Thought content normal.     ?   Judgment: Judgment normal.  ? ? ?BP 113/78   Pulse (!) 57   Temp 98.2 ?F (36.8 ?C) (Temporal)   Resp 20   Ht 5\' 4"  (1.626 m)   Wt 147 lb (66.7 kg)   BMI 25.23 kg/m?  ? ? ? ?   ?Assessment & Plan:  ? ? in today with chief complaint of Eczema flare up ? ? ?1. Infantile eczema ?Cool compresses ?Try t tinted moisturizer rather the n foundation. ?- methylPREDNISolone acetate (DEPO-MEDROL) injection 80 mg ?- predniSONE (DELTASONE) 20 MG tablet; 2 po at sametime daily for 5 days-  Dispense: 10 tablet; Refill: 0 ? ? ? ?The above assessment and management plan was discussed with the patient. The patient verbalized understanding of and has agreed to the management plan. Patient is aware to call the clinic if symptoms persist or worsen. Patient is aware when to return to the clinic for a follow-up visit. Patient educated on when it is appropriate to go to the emergency department.  ? ?Devlin-Margaret Charleston Ropes, FNP ? ? ?

## 2021-09-07 NOTE — Patient Instructions (Signed)
Eczema Eczema refers to a group of skin conditions that cause skin to become rough and inflamed. Each type of eczema has different triggers, symptoms, and treatments. Eczema of any type is usually itchy. Symptoms range from mild to severe. Eczema is not spread from person to person (is not contagious). It can appear on different parts of the body at different times. One person's eczema may look different from another person's eczema. What are the causes? The exact cause of this condition is not known. However, exposure to certain environmental factors, irritants, and allergens can make the condition worse. What are the signs or symptoms? Symptoms of this condition depend on the type of eczema you have. The types include: Contact dermatitis. There are two kinds: Irritant contact dermatitis. This happens when something irritates the skin and causes a rash. Allergic contact dermatitis. This happens when your skin comes in contact with something you are allergic to (allergens). This can include poison ivy, chemicals, or medicines that were applied to your skin. Atopic dermatitis. This is a long-term (chronic) skin disease that keeps coming back (recurring). It is the most common type of eczema. Usual symptoms are a red rash and itchy, dry, scaly skin. It usually starts showing signs in infancy and can last through adulthood. Dyshidrotic eczema. This is a form of eczema on the hands and feet. It shows up as very itchy, fluid-filled blisters. It can affect people of any age but is more common before age 40. Hand eczema. This causes very itchy areas of skin on the palms and sides of the hands and fingers. This type of eczema is common in industrial jobs where you may be exposed to different types of irritants. Lichen simplex chronicus. This type of eczema occurs when a person constantly scratches one area of the body. Repeated scratching of the area leads to thickened skin (lichenification). This condition can  accompany other types of eczema. It is more common in adults but may also be seen in children. Nummular eczema. This is a common type of eczema that most often affects the lower legs and the backs of the hands. It typically causes an itchy, red, circular, crusty lesion (plaque). Scratching may become a habit and can cause bleeding. Nummular eczema occurs most often in middle-aged or older people. Seborrheic dermatitis. This is a common skin disease that mainly affects the scalp. It may also affect other oily areas of the body, such as the face, sides of the nose, eyebrows, ears, eyelids, and chest. It is marked by small scaling and redness of the skin (erythema). This can affect people of all ages. In infants, this condition is called cradle cap. Stasis dermatitis. This is a common skin disease that can cause itching, scaling, and hyperpigmentation, usually on the legs and feet. It occurs most often in people who have a condition that prevents blood from being pumped through the veins in the legs (chronic venous insufficiency). Stasis dermatitis is a chronic condition that needs long-term management. How is this diagnosed? This condition may be diagnosed based on: A physical exam of your skin. Your medical history. Skin patch tests. These tests involve using patches that contain possible allergens and placing them on your back. Your health care provider will check in a few days to see if an allergic reaction occurred. How is this treated? Treatment for eczema is based on the type of eczema you have. You may be given hydrocortisone steroid medicine or antihistamines. These can relieve itching quickly and help reduce inflammation.   These may be prescribed or purchased over the counter, depending on the strength that is needed. Follow these instructions at home: Take or apply over-the-counter and prescription medicines only as told by your health care provider. Use creams or ointments to moisturize your  skin. Do not use lotions. Learn what triggers or irritates your symptoms so you can avoid these things. Treat symptom flare-ups quickly. Do not scratch your skin. This can make your rash worse. Keep all follow-up visits. This is important. Where to find more information American Academy of Dermatology: aad.org National Eczema Association: nationaleczema.org The Society for Pediatric Dermatology: pedsderm.net Contact a health care provider if: You have severe itching, even with treatment. You scratch your skin regularly until it bleeds. Your rash looks different than usual. Your skin is painful, swollen, or more red than usual. You have a fever. Summary Eczema refers to a group of skin conditions that cause skin to become rough and inflamed. Each type has different triggers. Eczema of any type causes itching that may range from mild to severe. Treatment varies based on the type of eczema you have. Hydrocortisone steroid medicine or antihistamines can help with itching and inflammation. Protecting your skin is the best way to prevent eczema. Use creams or ointments to moisturize your skin. Avoid triggers and irritants. Treat flare-ups quickly. This information is not intended to replace advice given to you by your health care provider. Make sure you discuss any questions you have with your health care provider. Document Revised: 03/14/2020 Document Reviewed: 03/14/2020 Elsevier Patient Education  2022 Elsevier Inc.  

## 2021-09-19 ENCOUNTER — Ambulatory Visit (INDEPENDENT_AMBULATORY_CARE_PROVIDER_SITE_OTHER): Payer: BC Managed Care – PPO | Admitting: Nurse Practitioner

## 2021-09-19 ENCOUNTER — Encounter: Payer: Self-pay | Admitting: Nurse Practitioner

## 2021-09-19 VITALS — BP 112/76 | HR 84 | Temp 98.2°F | Resp 20 | Ht 64.0 in | Wt 148.0 lb

## 2021-09-19 DIAGNOSIS — L308 Other specified dermatitis: Secondary | ICD-10-CM

## 2021-09-19 DIAGNOSIS — L2083 Infantile (acute) (chronic) eczema: Secondary | ICD-10-CM

## 2021-09-19 MED ORDER — PREDNISONE 10 MG (21) PO TBPK: ORAL_TABLET | ORAL | 0 refills | Status: DC

## 2021-09-19 NOTE — Patient Instructions (Signed)
Eczema Eczema refers to a group of skin conditions that cause skin to become rough and inflamed. Each type of eczema has different triggers, symptoms, and treatments. Eczema of any type is usually itchy. Symptoms range from mild to severe. Eczema is not spread from person to person (is not contagious). It can appear on different parts of the body at different times. One person's eczema may look different from another person's eczema. What are the causes? The exact cause of this condition is not known. However, exposure to certain environmental factors, irritants, and allergens can make the condition worse. What are the signs or symptoms? Symptoms of this condition depend on the type of eczema you have. The types include: Contact dermatitis. There are two kinds: Irritant contact dermatitis. This happens when something irritates the skin and causes a rash. Allergic contact dermatitis. This happens when your skin comes in contact with something you are allergic to (allergens). This can include poison ivy, chemicals, or medicines that were applied to your skin. Atopic dermatitis. This is a long-term (chronic) skin disease that keeps coming back (recurring). It is the most common type of eczema. Usual symptoms are a red rash and itchy, dry, scaly skin. It usually starts showing signs in infancy and can last through adulthood. Dyshidrotic eczema. This is a form of eczema on the hands and feet. It shows up as very itchy, fluid-filled blisters. It can affect people of any age but is more common before age 40. Hand eczema. This causes very itchy areas of skin on the palms and sides of the hands and fingers. This type of eczema is common in industrial jobs where you may be exposed to different types of irritants. Lichen simplex chronicus. This type of eczema occurs when a person constantly scratches one area of the body. Repeated scratching of the area leads to thickened skin (lichenification). This condition can  accompany other types of eczema. It is more common in adults but may also be seen in children. Nummular eczema. This is a common type of eczema that most often affects the lower legs and the backs of the hands. It typically causes an itchy, red, circular, crusty lesion (plaque). Scratching may become a habit and can cause bleeding. Nummular eczema occurs most often in middle-aged or older people. Seborrheic dermatitis. This is a common skin disease that mainly affects the scalp. It may also affect other oily areas of the body, such as the face, sides of the nose, eyebrows, ears, eyelids, and chest. It is marked by small scaling and redness of the skin (erythema). This can affect people of all ages. In infants, this condition is called cradle cap. Stasis dermatitis. This is a common skin disease that can cause itching, scaling, and hyperpigmentation, usually on the legs and feet. It occurs most often in people who have a condition that prevents blood from being pumped through the veins in the legs (chronic venous insufficiency). Stasis dermatitis is a chronic condition that needs long-term management. How is this diagnosed? This condition may be diagnosed based on: A physical exam of your skin. Your medical history. Skin patch tests. These tests involve using patches that contain possible allergens and placing them on your back. Your health care provider will check in a few days to see if an allergic reaction occurred. How is this treated? Treatment for eczema is based on the type of eczema you have. You may be given hydrocortisone steroid medicine or antihistamines. These can relieve itching quickly and help reduce inflammation.   These may be prescribed or purchased over the counter, depending on the strength that is needed. Follow these instructions at home: Take or apply over-the-counter and prescription medicines only as told by your health care provider. Use creams or ointments to moisturize your  skin. Do not use lotions. Learn what triggers or irritates your symptoms so you can avoid these things. Treat symptom flare-ups quickly. Do not scratch your skin. This can make your rash worse. Keep all follow-up visits. This is important. Where to find more information American Academy of Dermatology: aad.org National Eczema Association: nationaleczema.org The Society for Pediatric Dermatology: pedsderm.net Contact a health care provider if: You have severe itching, even with treatment. You scratch your skin regularly until it bleeds. Your rash looks different than usual. Your skin is painful, swollen, or more red than usual. You have a fever. Summary Eczema refers to a group of skin conditions that cause skin to become rough and inflamed. Each type has different triggers. Eczema of any type causes itching that may range from mild to severe. Treatment varies based on the type of eczema you have. Hydrocortisone steroid medicine or antihistamines can help with itching and inflammation. Protecting your skin is the best way to prevent eczema. Use creams or ointments to moisturize your skin. Avoid triggers and irritants. Treat flare-ups quickly. This information is not intended to replace advice given to you by your health care provider. Make sure you discuss any questions you have with your health care provider. Document Revised: 03/14/2020 Document Reviewed: 03/14/2020 Elsevier Patient Education  2022 Elsevier Inc.  

## 2021-09-19 NOTE — Progress Notes (Signed)
? ?Subjective:  ? ? Patient ID: Janet White, female    DOB: 22-Jan-1996, 26 y.o.   MRN: [id number] ? ? ?Chief Complaint: Eczema ? ? ?HPI ?Patient comes in today for eczema flare up. She was seen on 09/07/21 with flare p and was given a steroid shot and oral steroid 40mg  daily for 5 days thereafter. Today she says that when she took her last steroid pills, her face was clear. 2 days later she was broke out again. She gets dupexient and that is not working. Family may think that she has an auto immune disease that they dont know about. ? ? ? ?Review of Systems  ?Constitutional:  Negative for diaphoresis.  ?Eyes:  Negative for pain.  ?Respiratory:  Negative for shortness of breath.   ?Cardiovascular:  Negative for chest pain, palpitations and leg swelling.  ?Gastrointestinal:  Negative for abdominal pain.  ?Endocrine: Negative for polydipsia.  ?Skin:  Negative for rash.  ?Neurological:  Negative for dizziness, weakness and headaches.  ?Hematological:  Does not bruise/bleed easily.  ?All other systems reviewed and are negative. ? ?   ?Objective:  ? Physical Exam ?Vitals and nursing note reviewed.  ?Constitutional:   ?   General: She is not in acute distress. ?   Appearance: Normal appearance. She is well-developed.  ?Neck:  ?   Vascular: No carotid bruit or JVD.  ?Cardiovascular:  ?   Rate and Rhythm: Normal rate and regular rhythm.  ?   Heart sounds: Normal heart sounds.  ?Pulmonary:  ?   Effort: Pulmonary effort is normal. No respiratory distress.  ?   Breath sounds: Normal breath sounds. No wheezing or rales.  ?Chest:  ?   Chest wall: No tenderness.  ?Abdominal:  ?   General: Bowel sounds are normal. There is no distension or abdominal bruit.  ?   Palpations: Abdomen is soft. There is no hepatomegaly, splenomegaly, mass or pulsatile mass.  ?   Tenderness: There is no abdominal tenderness.  ?Musculoskeletal:     ?   General: Normal range of motion.  ?   Cervical back: Normal range of motion and neck supple.   ?Lymphadenopathy:  ?   Cervical: No cervical adenopathy.  ?Skin: ?   General: Skin is warm and dry.  ?   Comments: Face erythematous and flaky on bil cheeks, chin   ?Neurological:  ?   Mental Status: She is alert and oriented to person, place, and time.  ?   Deep Tendon Reflexes: Reflexes are normal and symmetric.  ?Psychiatric:     ?   Behavior: Behavior normal.     ?   Thought Content: Thought content normal.     ?   Judgment: Judgment normal.  ? ? ?BP 112/76   Pulse 84   Temp 98.2 ?F (36.8 ?C) (Temporal)   Resp 20   Ht 5\' 4"  (1.626 m)   Wt 148 lb (67.1 kg)   SpO2 98%   BMI 25.40 kg/m?  ? ? ? ?   ?Assessment & Plan:  ? in today with chief complaint of Eczema ? ? ?1. Autoimmune dermatitis ? ?- Ambulatory referral to Rheumatology ? ?2. Infantile eczema ?Meds ordered this encounter  ?Medications  ? predniSONE (STERAPRED UNI-PAK 21 TAB) 10 MG (21) TBPK tablet  ?  Sig: As directed x 6 days  ?  Dispense:  21 tablet  ?  Refill:  0  ?  Order Specific Question:   Supervising Provider  ?  Answer:  DETTINGER, JOSHUA A [[id number]]  ? ?Take daily claritin ?Follow up with dermatology ? ? ? ?The above assessment and management plan was discussed with the patient. The patient verbalized understanding of and has agreed to the management plan. Patient is aware to call the clinic if symptoms persist or worsen. Patient is aware when to return to the clinic for a follow-up visit. Patient educated on when it is appropriate to go to the emergency department.  ? ?Tazaria-Margaret Daphine Deutscher, FNP ? ? ? ?

## 2021-10-27 ENCOUNTER — Telehealth: Payer: Self-pay | Admitting: Nurse Practitioner

## 2021-10-30 NOTE — Telephone Encounter (Signed)
Please let patient know.

## 2021-10-30 NOTE — Telephone Encounter (Signed)
R/C to talk to [facility] about dermatology notes ?

## 2022-03-19 ENCOUNTER — Ambulatory Visit: Payer: BC Managed Care – PPO | Admitting: Family Medicine

## 2022-03-19 NOTE — Progress Notes (Deleted)
   [place] [address] Dept: [phone]  FOLLOW UP NOTE  Patient ID: Janet White, female    DOB: [birth year]  Age: 26 y.o. MRN: [id number] Date of Office Visit: 03/19/2022  Assessment  Chief Complaint: No chief complaint on file.  HPI Janet White is a 26 year old female who presents the clinic for follow-up visit.  She was last seen in the clinic as a new patient on 02/26/2020 by Dr. Nelva Bush for evaluation of atopic dermatitis and food allergy to peanut, tree nuts, green pea, and soy.   Drug Allergies:  Allergies  Allergen Reactions   Latex Rash   Peanuts [Peanut Oil]     All nuts    Sulfa Antibiotics     Physical Exam: There were no vitals taken for this visit.   Physical Exam  Diagnostics:    Assessment and Plan: No diagnosis found.  No orders of the defined types were placed in this encounter.   There are no Patient Instructions on file for this visit.  No follow-ups on file.    Thank you for the opportunity to care for this patient.  Please do not hesitate to contact me with questions.  Gareth Morgan, FNP [facility]

## 2022-04-04 NOTE — Progress Notes (Signed)
[place] [address] Dept: [id number]  FOLLOW UP NOTE  Patient ID: Janet White, female    DOB: 02-01-1996  Age: 26 y.o. MRN: [id number] Date of Office Visit: 04/05/2022  Assessment  Chief Complaint: Allergy Testing and Eczema (Eczema is flaring up and she wants to see if she can get anything for it)  HPI Janet White is a 26 year old female who presents to the clinic for a follow up visit. She was last seen in this clinic on 02/26/2020 by Dr. 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  At today's visit, she reports her allergic rhinitis has been moderately well controlled with occasional rhinorrhea and nasal congestion for which she takes an over-the-counter antihistamine daily.  She reports that she last took the antihistamine last night.  She is not currently using any nasal steroid spray or nasal saline rinses.  Allergic conjunctivitis is reported as well controlled with no medical intervention at this time.  Her last environmental allergy skin testing was on 02/26/2020 was positive to grass pollen, weed pollen, tree pollen, mold, dust mite, cat, and cockroach.  She continues to follow up with her dermatologist for management of atopic dermatitis with Dupixent injections at home once every 2 weeks.  She reports that she has been on Dupixent for about 4 to 5 years with occasional breakouts occurring.  She continues Cetaphil moisturizing routine twice a day and is not interested in any medications containing steroid.  She has not taken Dupixent injection over the last 4 weeks and is currently experiencing a severe atopic dermatitis flare with redness, cracking, and some drainage on her hands as well as redness and cracking in bilateral antecubital fossa.  She denies fever, sweats, and chills.  She reports that she takes doxycycline about twice a year for cellulitis related to atopic dermatitis  flare.  She continues to avoid soy, pecan, walnut, almond, hazelnut, pistachio, barley, wheat, and rye.  She notes that these foods did not cause anaphylactic symptoms, however, did not cause her eczema to flare.  Her last food allergy skin testing was on 02/26/2020 and was positive to soybean, pecan, walnut, almond, hazelnut, pistachio and barley and ngative to peanut, green pea, wheat, oat, rye, sesame, milk, egg, fish and shellfish.  She is interested in updating food allergy testing only at today's visit.  Her current medications are listed in the chart.   Drug Allergies:  Allergies  Allergen Reactions   Latex Rash   Peanuts [Peanut Oil]     All nuts    Sulfa Antibiotics     Physical Exam: BP 110/68   Pulse 83   Temp 98.3 F (36.8 C)   Resp 16   SpO2 100%    Physical Exam Vitals reviewed.  Constitutional:      Appearance: Normal appearance.  HENT:     Head: Normocephalic and atraumatic.     Right Ear: Tympanic membrane normal.     Left Ear: Tympanic membrane normal.     Nose:     Comments: Bilateral nares normal.  Pharynx normal.  Ears normal.  Eyes normal.    Mouth/Throat:     Pharynx: Oropharynx is clear.  Eyes:     Conjunctiva/sclera: Conjunctivae normal.  Cardiovascular:     Rate and Rhythm: Normal rate and regular rhythm.     Heart sounds: Normal heart sounds. No murmur heard. Pulmonary:     Effort: Pulmonary  effort is normal.     Breath sounds: Normal breath sounds.     Comments: Lungs clear to auscultation Musculoskeletal:        General: Normal range of motion.     Cervical back: Normal range of motion and neck supple.  Skin:    Comments: Palmar surfaces bilateral hands with dry, cracked, reddened skin.  Clear fluid oozing from some of the cracks.  Dorsal surface of both hands with scattered red dry eczematous patches.  Bilateral antecubital fossa with eczematous patches with cracked skin noted.  No drainage at the antecubital fossa areas  Neurological:      Mental Status: She is alert and oriented to person, place, and time.  Psychiatric:        Mood and Affect: Mood normal.        Behavior: Behavior normal.        Thought Content: Thought content normal.        Judgment: Judgment normal.     Assessment and Plan: 1. Allergy with anaphylaxis due to food   2. Intrinsic atopic dermatitis   3. Bacterial skin infection hands   4. Seasonal and perennial allergic rhinoconjunctivitis     Meds ordered this encounter  Medications   doxycycline (ADOXA) 100 MG tablet    Sig: Take 1 tablet (100 mg total) by mouth 2 (two) times daily.    Dispense:  20 tablet    Refill:  0    Patient Instructions  Allergic rhinitis Continue allergen avoidance measures directed toward pollen, mold, dust mite, cat, and cockroach as listed below Continue cetirizine 10 mg once a day as needed for runny nose or itch  Continue Flonase 1 spray in each nostril once a day as needed for a stuffy nose Consider saline nasal rinses as needed for nasal symptoms. Use this before any medicated nasal sprays for best result Consider allergen immunotherapy if your symptoms are not well controlled with the treatment plan as listed above  Allergic conjunctivitis Some over the counter eye drops include Pataday one drop in each eye once a day as needed for red, itchy eyes OR Zaditor one drop in each eye twice a day as needed for red itchy eyes.  Atopic dermatitis Continue a twice a day moisturizing routine Begin Eucrisa to red and itchy areas twice a day as needed.  This medication does not contain a steroid Discuss restarting Dupixent with your dermatologist. Other therapies include Rinvoq and Cibinqo  Skin infection Begin doxycycline 100 mg twice a day for 10 days  Food allergy Your skin testing was nonreactive today as the antihistamine was still in your system.  Return to the clinic next week for skin testing. Remember to stop antihistamines (allergy tablets) for 3 days  before your testing appointment Continue to avoid tree nuts, soy, barley, wheat, and rye.  In case of an allergic reaction, take Benadryl 50 mg every 4 hours, and if life-threatening symptoms occur, inject with EpiPen 0.3 mg.  Call the clinic if this treatment plan is not working well for you.  Follow up in 1 week or sooner if needed.   Return in about 1 week (around 04/12/2022), or if symptoms worsen or fail to improve.    Thank you for the opportunity to care for this patient.  Please do not hesitate to contact me with questions.  Thermon Leyland, FNP [facility]

## 2022-04-04 NOTE — Patient Instructions (Incomplete)
Allergic rhinitis Continue allergen avoidance measures directed toward pollen, mold, dust mite, cat, and cockroach as listed below Continue cetirizine 10 mg once a day as needed for runny nose or itch  Continue Flonase 1 spray in each nostril once a day as needed for a stuffy nose Consider saline nasal rinses as needed for nasal symptoms. Use this before any medicated nasal sprays for best result Consider allergen immunotherapy if your symptoms are not well controlled with the treatment plan as listed above  Allergic conjunctivitis Some over the counter eye drops include Pataday one drop in each eye once a day as needed for red, itchy eyes OR Zaditor one drop in each eye twice a day as needed for red itchy eyes.  Atopic dermatitis Continue a twice a day moisturizing routine Begin Eucrisa to red and itchy areas twice a day as needed.  This medication does not contain a steroid Discuss restarting Dupixent with your dermatologist. Other therapies include Rinvoq and Cibinqo  Skin infection Begin doxycycline 100 mg twice a day for 10 days  Food allergy Your skin testing was nonreactive today as the antihistamine was still in your system.  Return to the clinic next week for skin testing. Remember to stop antihistamines (allergy tablets) for 3 days before your testing appointment Continue to avoid tree nuts, soy, barley, wheat, and rye.  In case of an allergic reaction, take Benadryl 50 mg every 4 hours, and if life-threatening symptoms occur, inject with EpiPen 0.3 mg.  Call the clinic if this treatment plan is not working well for you.  Follow up in 1 week or sooner if needed.  Reducing Pollen Exposure The American Academy of Allergy, Asthma and Immunology suggests the following steps to reduce your exposure to pollen during allergy seasons. Do not hang sheets or clothing out to dry; pollen may collect on these items. Do not mow lawns or spend time around freshly cut grass; mowing stirs up  pollen. Keep windows closed at night.  Keep car windows closed while driving. Minimize morning activities outdoors, a time when pollen counts are usually at their highest. Stay indoors as much as possible when pollen counts or humidity is high and on windy days when pollen tends to remain in the air longer. Use air conditioning when possible.  Many air conditioners have filters that trap the pollen spores. Use a HEPA room air filter to remove pollen form the indoor air you breathe.  Control of Mold Allergen Mold and fungi can grow on a variety of surfaces provided certain temperature and moisture conditions exist.  Outdoor molds grow on plants, decaying vegetation and soil.  The major outdoor mold, Alternaria and Cladosporium, are found in very high numbers during hot and dry conditions.  Generally, a late Summer - Fall peak is seen for common outdoor fungal spores.  Rain will temporarily lower outdoor mold spore count, but counts rise rapidly when the rainy period ends.  The most important indoor molds are Aspergillus and Penicillium.  Dark, humid and poorly ventilated basements are ideal sites for mold growth.  The next most common sites of mold growth are the bathroom and the kitchen.  Outdoor [employer] Use air conditioning and keep windows closed Avoid exposure to decaying vegetation. Avoid leaf raking. Avoid grain handling. Consider wearing a face mask if working in moldy areas.  Indoor Mold Control Maintain humidity below 50%. Clean washable surfaces with 5% bleach solution. Remove sources e.g. Contaminated carpets.   Control of Dust Mite Allergen Dust  mites play a major role in allergic asthma and rhinitis. They occur in environments with high humidity wherever human skin is found. Dust mites absorb humidity from the atmosphere (ie, they do not drink) and feed on organic matter (including shed human and animal skin). Dust mites are a microscopic type of insect that you cannot see  with the naked eye. High levels of dust mites have been detected from mattresses, pillows, carpets, upholstered furniture, bed covers, clothes, soft toys and any woven material. The principal allergen of the dust mite is found in its feces. A gram of dust may contain 1,000 mites and 250,000 fecal particles. Mite antigen is easily measured in the air during house cleaning activities. Dust mites do not bite and do not cause harm to humans, other than by triggering allergies/asthma.  Ways to decrease your exposure to dust mites in your home:  1. Encase mattresses, box springs and pillows with a mite-impermeable barrier or cover  2. Wash sheets, blankets and drapes weekly in hot water (130 F) with detergent and dry them in a dryer on the hot setting.  3. Have the room cleaned frequently with a vacuum cleaner and a damp dust-mop. For carpeting or rugs, vacuuming with a vacuum cleaner equipped with a high-efficiency particulate air (HEPA) filter. The dust mite allergic individual should not be in a room which is being cleaned and should wait 1 hour after cleaning before going into the room.  4. Do not sleep on upholstered furniture (eg, couches).  5. If possible removing carpeting, upholstered furniture and drapery from the home is ideal. Horizontal blinds should be eliminated in the rooms where the person spends the most time (bedroom, study, television room). Washable vinyl, roller-type shades are optimal.  6. Remove all non-washable stuffed toys from the bedroom. Wash stuffed toys weekly like sheets and blankets above.  7. Reduce indoor humidity to less than 50%. Inexpensive humidity monitors can be purchased at most hardware stores. Do not use a humidifier as can make the problem worse and are not recommended.  Control of Dog or Cat Allergen Avoidance is the best way to manage a dog or cat allergy. If you have a dog or cat and are allergic to dog or cats, consider removing the dog or cat from the  home. If you have a dog or cat but don't want to find it a new home, or if your family wants a pet even though someone in the household is allergic, here are some strategies that may help keep symptoms at bay:  Keep the pet out of your bedroom and restrict it to only a few rooms. Be advised that keeping the dog or cat in only one room will not limit the allergens to that room. Don't pet, hug or kiss the dog or cat; if you do, wash your hands with soap and water. High-efficiency particulate air (HEPA) cleaners run continuously in a bedroom or living room can reduce allergen levels over time. Regular use of a high-efficiency vacuum cleaner or a central vacuum can reduce allergen levels. Giving your dog or cat a bath at least once a week can reduce airborne allergen.  Control of Cockroach Allergen Cockroach allergen has been identified as an important cause of acute attacks of asthma, especially in urban settings.  There are fifty-five species of cockroach that exist in the [place], however only three, the [place], [place] species produce allergen that can affect patients with Asthma.  Allergens can be obtained from  fecal particles, egg casings and secretions from cockroaches.    Remove food sources. Reduce access to water. Seal access and entry points. Spray runways with 0.5-1% Diazinon or Chlorpyrifos Blow boric acid power under stoves and refrigerator. Place bait stations (hydramethylnon) at feeding sites.

## 2022-04-05 ENCOUNTER — Encounter: Payer: Self-pay | Admitting: Family Medicine

## 2022-04-05 ENCOUNTER — Ambulatory Visit (INDEPENDENT_AMBULATORY_CARE_PROVIDER_SITE_OTHER): Payer: BC Managed Care – PPO | Admitting: Family Medicine

## 2022-04-05 VITALS — BP 110/68 | HR 83 | Temp 98.3°F | Resp 16

## 2022-04-05 DIAGNOSIS — B9689 Other specified bacterial agents as the cause of diseases classified elsewhere: Secondary | ICD-10-CM

## 2022-04-05 DIAGNOSIS — T7800XA Anaphylactic reaction due to unspecified food, initial encounter: Secondary | ICD-10-CM | POA: Diagnosis not present

## 2022-04-05 DIAGNOSIS — L2084 Intrinsic (allergic) eczema: Secondary | ICD-10-CM

## 2022-04-05 DIAGNOSIS — L089 Local infection of the skin and subcutaneous tissue, unspecified: Secondary | ICD-10-CM

## 2022-04-05 DIAGNOSIS — H1010 Acute atopic conjunctivitis, unspecified eye: Secondary | ICD-10-CM

## 2022-04-05 DIAGNOSIS — J302 Other seasonal allergic rhinitis: Secondary | ICD-10-CM | POA: Diagnosis not present

## 2022-04-05 DIAGNOSIS — H1013 Acute atopic conjunctivitis, bilateral: Secondary | ICD-10-CM

## 2022-04-05 MED ORDER — DOXYCYCLINE MONOHYDRATE 100 MG PO TABS: 100.0000 mg | ORAL_TABLET | Freq: Two times a day (BID) | ORAL | 0 refills | Status: DC

## 2022-04-11 NOTE — Progress Notes (Signed)
[place] [address] Dept: [id number]  FOLLOW UP NOTE  Patient ID: Janet White, female    DOB: [birth year]  Age: 26 y.o. MRN: [id number] Date of Office Visit: 04/12/2022  Assessment  Chief Complaint: Allergy Testing (Does it every few years.)  HPI Janet White is a 26 year old female who presents to the clinic for follow-up visit with allergy skin testing.  She was last seen in this clinic on 04/05/2022 by Gareth Morgan, FNP, for evaluation of allergic rhinitis, allergic conjunctivitis, atopic dermatitis, skin infection requiring doxycycline, and food allergy to soy, tree nut, barley, wheat, and rye.  At that time, she had taken antihistamine and her skin testing control test was not reactive.  At today's visit, she reports that she is feeling well overall with some nasal congestion and clear rhinorrhea after stopping antihistamines over the last week.  Atopic dermatitis with skin infection is reported as much more well controlled than at her last visit to this clinic.  She continues doxycycline 100 mg twice a day and has restarted her Dupixent injections on Monday.  She continues to avoid soy, tree nuts, and barley with no accidental ingestion or EpiPen use since her last visit to this clinic.  She is interested in retesting for the food panel as she reports that her food allergies change every year.  Her current medications are listed in the chart.   Drug Allergies:  Allergies  Allergen Reactions   Barley Grass Rash   Latex Rash   Soybean-Containing Drug Products Rash   Peanuts [Peanut Oil]     All nuts    Sulfa Antibiotics     Physical Exam: BP 112/76   Pulse 78   Temp 98.5 F (36.9 C) (Temporal)   Resp 16   Ht 5\' 4"  (1.626 m)   Wt 147 lb (66.7 kg)   SpO2 100%   BMI 25.23 kg/m    Physical Exam Vitals reviewed.  Constitutional:      Appearance: Normal appearance.  HENT:     Head: Normocephalic and atraumatic.     Nose:     Comments: Bilateral nares  slightly erythematous with clear nasal drainage noted.  Pharynx normal.  Ears normal.  Eyes normal.    Mouth/Throat:     Pharynx: Oropharynx is clear.  Eyes:     Conjunctiva/sclera: Conjunctivae normal.  Cardiovascular:     Rate and Rhythm: Normal rate and regular rhythm.     Heart sounds: Normal heart sounds. No murmur heard. Pulmonary:     Effort: Pulmonary effort is normal.     Breath sounds: Normal breath sounds.     Comments: Lungs clear to auscultation Musculoskeletal:        General: Normal range of motion.     Cervical back: Normal range of motion and neck supple.  Skin:    General: Skin is warm and dry.     Comments: Bilateral hands with eczematous patches noted.  Some scabbed areas.  No drainage.  Neurological:     Mental Status: She is alert and oriented to person, place, and time.  Psychiatric:        Mood and Affect: Mood normal.        Behavior: Behavior normal.        Thought Content: Thought content normal.        Judgment: Judgment normal.     Diagnostics: Full adult food panel positive to almond, hazelnut, and mustard with adequate controls.  Assessment and Plan: 1.  Allergy with anaphylaxis due to food   2. Seasonal and perennial allergic rhinoconjunctivitis   3. Intrinsic atopic dermatitis   4. Bacterial skin infection hands   5. Seasonal and perennial allergic rhinitis      Patient Instructions  Allergic rhinitis Continue allergen avoidance measures directed toward pollen, mold, dust mite, cat, and cockroach as listed below Continue cetirizine 10 mg once a day as needed for runny nose or itch  Continue Flonase 1 spray in each nostril once a day as needed for a stuffy nose Consider saline nasal rinses as needed for nasal symptoms. Use this before any medicated nasal sprays for best result Consider allergen immunotherapy if your symptoms are not well controlled with the treatment plan as listed above  Allergic conjunctivitis Some over the counter eye  drops include Pataday one drop in each eye once a day as needed for red, itchy eyes OR Zaditor one drop in each eye twice a day as needed for red itchy eyes.  Atopic dermatitis Continue a twice a day moisturizing routine Begin Eucrisa to red and itchy areas twice a day as needed.  This medication does not contain a steroid Discuss restarting Dupixent with your dermatologist. Other therapies include Rinvoq and Cibinqo  Skin infection Continue doxycycline 100 mg twice a day   Food allergy Your skin testing was positive to almond, hazelnut, and mustard Continue to avoid tree nuts, soy, barley, and mustard.  In case of an allergic reaction, take Benadryl 50 mg every 4 hours, and if life-threatening symptoms occur, inject with EpiPen 0.3 mg. Lab orders have been entered to help [place] determine your food allergy. We will call when the results become available .  Call the clinic if this treatment plan is not working well for you.  Follow up in 6 months or sooner if needed.   Return in about 6 months (around 10/12/2022), or if symptoms worsen or fail to improve.    Thank you for the opportunity to care for this patient.  Please do not hesitate to contact me with questions.  Thermon Leyland, FNP [facility]

## 2022-04-11 NOTE — Patient Instructions (Incomplete)
Allergic rhinitis Continue allergen avoidance measures directed toward pollen, mold, dust mite, cat, and cockroach as listed below Continue cetirizine 10 mg once a day as needed for runny nose or itch  Continue Flonase 1 spray in each nostril once a day as needed for a stuffy nose Consider saline nasal rinses as needed for nasal symptoms. Use this before any medicated nasal sprays for best result Consider allergen immunotherapy if your symptoms are not well controlled with the treatment plan as listed above  Allergic conjunctivitis Some over the counter eye drops include Pataday one drop in each eye once a day as needed for red, itchy eyes OR Zaditor one drop in each eye twice a day as needed for red itchy eyes.  Atopic dermatitis Continue a twice a day moisturizing routine Begin Eucrisa to red and itchy areas twice a day as needed.  This medication does not contain a steroid Discuss restarting Dupixent with your dermatologist. Other therapies include Rinvoq and Cibinqo  Skin infection Continue doxycycline 100 mg twice a day   Food allergy Your skin testing was positive to almond, hazelnut, and mustard Continue to avoid tree nuts, soy, barley, and mustard.  In case of an allergic reaction, take Benadryl 50 mg every 4 hours, and if life-threatening symptoms occur, inject with EpiPen 0.3 mg. Lab orders have been entered to help [place] determine your food allergy. We will call when the results become available .  Call the clinic if this treatment plan is not working well for you.  Follow up in 6 months or sooner if needed.  Reducing Pollen Exposure The American Academy of Allergy, Asthma and Immunology suggests the following steps to reduce your exposure to pollen during allergy seasons. Do not hang sheets or clothing out to dry; pollen may collect on these items. Do not mow lawns or spend time around freshly cut grass; mowing stirs up pollen. Keep windows closed at night.  Keep car windows  closed while driving. Minimize morning activities outdoors, a time when pollen counts are usually at their highest. Stay indoors as much as possible when pollen counts or humidity is high and on windy days when pollen tends to remain in the air longer. Use air conditioning when possible.  Many air conditioners have filters that trap the pollen spores. Use a HEPA room air filter to remove pollen form the indoor air you breathe.  Control of Mold Allergen Mold and fungi can grow on a variety of surfaces provided certain temperature and moisture conditions exist.  Outdoor molds grow on plants, decaying vegetation and soil.  The major outdoor mold, Alternaria and Cladosporium, are found in very high numbers during hot and dry conditions.  Generally, a late Summer - Fall peak is seen for common outdoor fungal spores.  Rain will temporarily lower outdoor mold spore count, but counts rise rapidly when the rainy period ends.  The most important indoor molds are Aspergillus and Penicillium.  Dark, humid and poorly ventilated basements are ideal sites for mold growth.  The next most common sites of mold growth are the bathroom and the kitchen.  Outdoor [employer] Use air conditioning and keep windows closed Avoid exposure to decaying vegetation. Avoid leaf raking. Avoid grain handling. Consider wearing a face mask if working in moldy areas.  Indoor Mold Control Maintain humidity below 50%. Clean washable surfaces with 5% bleach solution. Remove sources e.g. Contaminated carpets.   Control of Dust Mite Allergen Dust mites play a major role in allergic asthma and  rhinitis. They occur in environments with high humidity wherever human skin is found. Dust mites absorb humidity from the atmosphere (ie, they do not drink) and feed on organic matter (including shed human and animal skin). Dust mites are a microscopic type of insect that you cannot see with the naked eye. High levels of dust mites have been  detected from mattresses, pillows, carpets, upholstered furniture, bed covers, clothes, soft toys and any woven material. The principal allergen of the dust mite is found in its feces. A gram of dust may contain 1,000 mites and 250,000 fecal particles. Mite antigen is easily measured in the air during house cleaning activities. Dust mites do not bite and do not cause harm to humans, other than by triggering allergies/asthma.  Ways to decrease your exposure to dust mites in your home:  1. Encase mattresses, box springs and pillows with a mite-impermeable barrier or cover  2. Wash sheets, blankets and drapes weekly in hot water (130 F) with detergent and dry them in a dryer on the hot setting.  3. Have the room cleaned frequently with a vacuum cleaner and a damp dust-mop. For carpeting or rugs, vacuuming with a vacuum cleaner equipped with a high-efficiency particulate air (HEPA) filter. The dust mite allergic individual should not be in a room which is being cleaned and should wait 1 hour after cleaning before going into the room.  4. Do not sleep on upholstered furniture (eg, couches).  5. If possible removing carpeting, upholstered furniture and drapery from the home is ideal. Horizontal blinds should be eliminated in the rooms where the person spends the most time (bedroom, study, television room). Washable vinyl, roller-type shades are optimal.  6. Remove all non-washable stuffed toys from the bedroom. Wash stuffed toys weekly like sheets and blankets above.  7. Reduce indoor humidity to less than 50%. Inexpensive humidity monitors can be purchased at most hardware stores. Do not use a humidifier as can make the problem worse and are not recommended.  Control of Dog or Cat Allergen Avoidance is the best way to manage a dog or cat allergy. If you have a dog or cat and are allergic to dog or cats, consider removing the dog or cat from the home. If you have a dog or cat but don't want to find it  a new home, or if your family wants a pet even though someone in the household is allergic, here are some strategies that may help keep symptoms at bay:  Keep the pet out of your bedroom and restrict it to only a few rooms. Be advised that keeping the dog or cat in only one room will not limit the allergens to that room. Don't pet, hug or kiss the dog or cat; if you do, wash your hands with soap and water. High-efficiency particulate air (HEPA) cleaners run continuously in a bedroom or living room can reduce allergen levels over time. Regular use of a high-efficiency vacuum cleaner or a central vacuum can reduce allergen levels. Giving your dog or cat a bath at least once a week can reduce airborne allergen.  Control of Cockroach Allergen Cockroach allergen has been identified as an important cause of acute attacks of asthma, especially in urban settings.  There are fifty-five species of cockroach that exist in the [place], however only three, the [place], [place] species produce allergen that can affect patients with Asthma.  Allergens can be obtained from fecal particles, egg casings and secretions from cockroaches.  Remove food sources. Reduce access to water. Seal access and entry points. Spray runways with 0.5-1% Diazinon or Chlorpyrifos Blow boric acid power under stoves and refrigerator. Place bait stations (hydramethylnon) at feeding sites.

## 2022-04-12 ENCOUNTER — Ambulatory Visit: Payer: BC Managed Care – PPO | Admitting: Family Medicine

## 2022-04-12 ENCOUNTER — Encounter: Payer: Self-pay | Admitting: Family Medicine

## 2022-04-12 VITALS — BP 112/76 | HR 78 | Temp 98.5°F | Resp 16 | Ht 64.0 in | Wt 147.0 lb

## 2022-04-12 DIAGNOSIS — H1013 Acute atopic conjunctivitis, bilateral: Secondary | ICD-10-CM

## 2022-04-12 DIAGNOSIS — T7800XA Anaphylactic reaction due to unspecified food, initial encounter: Secondary | ICD-10-CM | POA: Diagnosis not present

## 2022-04-12 DIAGNOSIS — J302 Other seasonal allergic rhinitis: Secondary | ICD-10-CM | POA: Diagnosis not present

## 2022-04-12 DIAGNOSIS — L2084 Intrinsic (allergic) eczema: Secondary | ICD-10-CM | POA: Diagnosis not present

## 2022-04-12 DIAGNOSIS — L089 Local infection of the skin and subcutaneous tissue, unspecified: Secondary | ICD-10-CM

## 2022-04-12 DIAGNOSIS — H1010 Acute atopic conjunctivitis, unspecified eye: Secondary | ICD-10-CM

## 2022-04-12 DIAGNOSIS — B9689 Other specified bacterial agents as the cause of diseases classified elsewhere: Secondary | ICD-10-CM

## 2022-04-15 LAB — ALLERGENS(7)
Brazil Nut IgE: <0.1 kU/L
F020-IgE Almond: <0.1 kU/L
F202-IgE Cashew Nut: <0.1 kU/L
Hazelnut (Filbert) IgE: 0.19 kU/L — AB
Peanut IgE: <0.1 kU/L
Pecan Nut IgE: <0.1 kU/L
Walnut IgE: 0.26 kU/L — AB

## 2022-04-15 LAB — ALLERGEN SOYBEAN: Soybean IgE: <0.1 kU/L

## 2022-04-15 LAB — ALLERGEN, MUSTARD, F89: F089-IgE Mustard: <0.1 kU/L

## 2022-04-15 LAB — ALLERGEN BARLEY F6: Allergen Barley IgE: 0.17 kU/L — AB

## 2022-04-16 ENCOUNTER — Telehealth: Payer: Self-pay

## 2022-04-16 MED ORDER — MELOXICAM 15 MG PO TABS: 15.0000 mg | ORAL_TABLET | Freq: Every day | ORAL | 0 refills | Status: DC

## 2022-04-16 NOTE — Addendum Note (Signed)
Addended by: Chevis Pretty on: 04/16/2022 01:39 PM   Modules accepted: Orders

## 2022-04-16 NOTE — Progress Notes (Signed)
Can you please let this patient know that her lab testing is back and shows that her testing to tree nuts was low enough for an in office food challenge. Her level for barley was low enough for an in office food challenge. Soy and mustard were negative. She needs to come into the clinic for a soy challenge before introducing it into her diet. Thank you

## 2022-04-16 NOTE — Telephone Encounter (Signed)
Spoke with pt , wanting to know if she can have a refill on Meloxicam 15mg   - if needed she states she can come in for a office visit

## 2022-04-17 ENCOUNTER — Encounter: Payer: Self-pay | Admitting: Nurse Practitioner

## 2022-04-17 ENCOUNTER — Ambulatory Visit (INDEPENDENT_AMBULATORY_CARE_PROVIDER_SITE_OTHER): Payer: BC Managed Care – PPO | Admitting: Nurse Practitioner

## 2022-04-17 VITALS — BP 122/85 | HR 82 | Temp 97.8°F | Resp 20 | Ht 64.0 in | Wt 147.0 lb

## 2022-04-17 DIAGNOSIS — L2083 Infantile (acute) (chronic) eczema: Secondary | ICD-10-CM

## 2022-04-17 DIAGNOSIS — L2084 Intrinsic (allergic) eczema: Secondary | ICD-10-CM | POA: Diagnosis not present

## 2022-04-17 MED ORDER — PREDNISONE 10 MG (21) PO TBPK: ORAL_TABLET | ORAL | 0 refills | Status: DC

## 2022-04-17 MED ORDER — METHYLPREDNISOLONE ACETATE 80 MG/ML IJ SUSP
80.0000 mg | Freq: Once | INTRAMUSCULAR | Status: AC
  Administered 2022-04-17: 80 mg via INTRAMUSCULAR

## 2022-04-17 NOTE — Progress Notes (Signed)
Thank you :)

## 2022-04-17 NOTE — Patient Instructions (Signed)
Eczema Eczema refers to a group of skin conditions that cause skin to become rough and inflamed. Each type of eczema has different triggers, symptoms, and treatments. Eczema of any type is usually itchy. Symptoms range from mild to severe. Eczema is not spread from person to person (is not contagious). It can appear on different parts of the body at different times. One person's eczema may look different from another person's eczema. What are the causes? The exact cause of this condition is not known. However, exposure to certain environmental factors, irritants, and allergens can make the condition worse. What are the signs or symptoms? Symptoms of this condition depend on the type of eczema you have. The types include: Contact dermatitis. There are two kinds: Irritant contact dermatitis. This happens when something irritates the skin and causes a rash. Allergic contact dermatitis. This happens when your skin comes in contact with something you are allergic to (allergens). This can include poison ivy, chemicals, or medicines that were applied to your skin. Atopic dermatitis. This is a long-term (chronic) skin disease that keeps coming back (recurring). It is the most common type of eczema. Usual symptoms are a red rash and itchy, dry, scaly skin. It usually starts showing signs in infancy and can last through adulthood. Dyshidrotic eczema. This is a form of eczema on the hands and feet. It shows up as very itchy, fluid-filled blisters. It can affect people of any age but is more common before age 40. Hand eczema. This causes very itchy areas of skin on the palms and sides of the hands and fingers. This type of eczema is common in industrial jobs where you may be exposed to different types of irritants. Lichen simplex chronicus. This type of eczema occurs when a person constantly scratches one area of the body. Repeated scratching of the area leads to thickened skin (lichenification). This condition can  accompany other types of eczema. It is more common in adults but may also be seen in children. Nummular eczema. This is a common type of eczema that most often affects the lower legs and the backs of the hands. It typically causes an itchy, red, circular, crusty lesion (plaque). Scratching may become a habit and can cause bleeding. Nummular eczema occurs most often in middle-aged or older people. Seborrheic dermatitis. This is a common skin disease that mainly affects the scalp. It may also affect other oily areas of the body, such as the face, sides of the nose, eyebrows, ears, eyelids, and chest. It is marked by small scaling and redness of the skin (erythema). This can affect people of all ages. In infants, this condition is called cradle cap. Stasis dermatitis. This is a common skin disease that can cause itching, scaling, and hyperpigmentation, usually on the legs and feet. It occurs most often in people who have a condition that prevents blood from being pumped through the veins in the legs (chronic venous insufficiency). Stasis dermatitis is a chronic condition that needs long-term management. How is this diagnosed? This condition may be diagnosed based on: A physical exam of your skin. Your medical history. Skin patch tests. These tests involve using patches that contain possible allergens and placing them on your back. Your health care provider will check in a few days to see if an allergic reaction occurred. How is this treated? Treatment for eczema is based on the type of eczema you have. You may be given hydrocortisone steroid medicine or antihistamines. These can relieve itching quickly and help reduce inflammation.   These may be prescribed or purchased over the counter, depending on the strength that is needed. Follow these instructions at home: Take or apply over-the-counter and prescription medicines only as told by your health care provider. Use creams or ointments to moisturize your  skin. Do not use lotions. Learn what triggers or irritates your symptoms so you can avoid these things. Treat symptom flare-ups quickly. Do not scratch your skin. This can make your rash worse. Keep all follow-up visits. This is important. Where to find more information American Academy of Dermatology: aad.org National Eczema Association: nationaleczema.org The Society for Pediatric Dermatology: pedsderm.net Contact a health care provider if: You have severe itching, even with treatment. You scratch your skin regularly until it bleeds. Your rash looks different than usual. Your skin is painful, swollen, or more red than usual. You have a fever. Summary Eczema refers to a group of skin conditions that cause skin to become rough and inflamed. Each type has different triggers. Eczema of any type causes itching that may range from mild to severe. Treatment varies based on the type of eczema you have. Hydrocortisone steroid medicine or antihistamines can help with itching and inflammation. Protecting your skin is the best way to prevent eczema. Use creams or ointments to moisturize your skin. Avoid triggers and irritants. Treat flare-ups quickly. This information is not intended to replace advice given to you by your health care provider. Make sure you discuss any questions you have with your health care provider. Document Revised: 03/14/2020 Document Reviewed: 03/14/2020 Elsevier Patient Education  2023 Elsevier Inc.  

## 2022-04-17 NOTE — Progress Notes (Signed)
   Subjective:    Patient ID: Janet White, female    DOB: 1996/02/07, 26 y.o.   MRN: [id number]   Chief Complaint: Eczema   HPI  Patient has a long history of eczema. She has flare up several times a year. The worse flare up is her hands. That are dry swollen and hurt.       Review of Systems  Constitutional:  Negative for diaphoresis.  Eyes:  Negative for pain.  Respiratory:  Negative for shortness of breath.   Cardiovascular:  Negative for chest pain, palpitations and leg swelling.  Gastrointestinal:  Negative for abdominal pain.  Endocrine: Negative for polydipsia.  Skin:  Negative for rash.  Neurological:  Negative for dizziness, weakness and headaches.  Hematological:  Does not bruise/bleed easily.  All other systems reviewed and are negative.      Objective:   Physical Exam Constitutional:      Appearance: Normal appearance.  Cardiovascular:     Rate and Rhythm: Normal rate and regular rhythm.     Heart sounds: Normal heart sounds.  Pulmonary:     Breath sounds: Normal breath sounds.  Skin:    Comments: bil hands swollen, dry and cracked open  Neurological:     General: No focal deficit present.     Mental Status: She is alert and oriented to person, place, and time.  Psychiatric:        Mood and Affect: Mood normal.        Behavior: Behavior normal.     BP 122/85   Pulse 82   Temp 97.8 F (36.6 C) (Temporal)   Resp 20   Ht 5\' 4"  (1.626 m)   Wt 147 lb (66.7 kg)   BMI 25.23 kg/m        Assessment & Plan:   Janet White in today with chief complaint of Eczema   1. Infantile eczema Avoid scratching Heavy lotion RTO prn - predniSONE (STERAPRED UNI-PAK 21 TAB) 10 MG (21) TBPK tablet; As directed x 6 days  Dispense: 21 tablet; Refill: 0 - methylPREDNISolone acetate (DEPO-MEDROL) injection 80 mg    The above assessment and management plan was discussed with the patient. The patient verbalized understanding of and has agreed to the management  plan. Patient is aware to call the clinic if symptoms persist or worsen. Patient is aware when to return to the clinic for a follow-up visit. Patient educated on when it is appropriate to go to the emergency department.   Jeannette-Margaret Hassell Done, FNP

## 2022-05-14 ENCOUNTER — Other Ambulatory Visit: Payer: Self-pay | Admitting: Nurse Practitioner

## 2022-05-14 ENCOUNTER — Telehealth: Payer: Self-pay | Admitting: Nurse Practitioner

## 2022-05-14 NOTE — Telephone Encounter (Signed)
Pt called requesting to speak with MMM. Has question about her eczema.

## 2022-05-14 NOTE — Telephone Encounter (Signed)
Called and spoke with patient and asked if I could relay message. Patient stated that she would like to speak to you personally. Please call

## 2022-05-21 ENCOUNTER — Ambulatory Visit (INDEPENDENT_AMBULATORY_CARE_PROVIDER_SITE_OTHER): Payer: BC Managed Care – PPO | Admitting: Nurse Practitioner

## 2022-05-21 ENCOUNTER — Encounter: Payer: Self-pay | Admitting: Nurse Practitioner

## 2022-05-21 DIAGNOSIS — R6889 Other general symptoms and signs: Secondary | ICD-10-CM | POA: Diagnosis not present

## 2022-05-21 MED ORDER — OSELTAMIVIR PHOSPHATE 75 MG PO CAPS: 75.0000 mg | ORAL_CAPSULE | Freq: Two times a day (BID) | ORAL | 0 refills | Status: DC

## 2022-05-21 MED ORDER — PROMETHAZINE-DM 6.25-15 MG/5ML PO SYRP: 5.0000 mL | ORAL_SOLUTION | Freq: Four times a day (QID) | ORAL | 0 refills | Status: DC | PRN

## 2022-05-21 NOTE — Progress Notes (Signed)
Virtual Visit  Note Due to COVID-19 pandemic this visit was conducted virtually. This visit type was conducted due to national recommendations for restrictions regarding the COVID-19 Pandemic (e.g. social distancing, sheltering in place) in an effort to limit this patient's exposure and mitigate transmission in our community. All issues noted in this document were discussed and addressed.  A physical exam was not performed with this format.  I connected with Janet White on 05/21/22 at 3:55 by telephone and verified that I am speaking with the correct person using two identifiers. Janet White is currently located at home and no one is currently with her during visit. The provider, Phynix-Margaret Daphine Deutscher, FNP is located in their office at time of visit.  I discussed the limitations, risks, security and privacy concerns of performing an evaluation and management service by telephone and the availability of in person appointments. I also discussed with the patient that there may be a patient responsible charge related to this service. The patient expressed understanding and agreed to proceed.   History and Present Illness:  Patient says she developed body aches and fever. Has gotten worse. Fever will not go away. Covid test was negative. Deep dry cough      Review of Systems  Constitutional:  Positive for chills and fever.  HENT:  Positive for congestion.   Respiratory:  Positive for cough and sputum production.   Neurological:  Positive for headaches. Negative for dizziness.     Observations/Objective: Alert and oriented- answers all questions appropriately No distress Raspy voice Deep dry cough  Assessment and Plan: Janet White in today with chief complaint of No chief complaint on file.   1. Flu-like symptoms 1. Take meds as prescribed 2. Use a cool mist humidifier especially during the winter months and when heat has been humid. 3. Use saline nose sprays frequently 4. Saline  irrigations of the nose can be very helpful if done frequently.  * 4X daily for 1 week*  * Use of a nettie pot can be helpful with this. Follow directions with this* 5. Drink plenty of fluids 6. Keep thermostat turn down low 7.For any cough or congestion- promethazine DM 8. For fever or aces or pains- take tylenol or ibuprofen appropriate for age and weight.  * for fevers greater than 101 orally you may alternate ibuprofen and tylenol every  3 hours.    - oseltamivir (TAMIFLU) 75 MG capsule; Take 1 capsule (75 mg total) by mouth 2 (two) times daily.  Dispense: 10 capsule; Refill: 0 - promethazine-dextromethorphan (PROMETHAZINE-DM) 6.25-15 MG/5ML syrup; Take 5 mLs by mouth 4 (four) times daily as needed for cough.  Dispense: 118 mL; Refill: 0    Follow Up Instructions: prn    I discussed the assessment and treatment plan with the patient. The patient was provided an opportunity to ask questions and all were answered. The patient agreed with the plan and demonstrated an understanding of the instructions.   The patient was advised to call back or seek an in-person evaluation if the symptoms worsen or if the condition fails to improve as anticipated.  The above assessment and management plan was discussed with the patient. The patient verbalized understanding of and has agreed to the management plan. Patient is aware to call the clinic if symptoms persist or worsen. Patient is aware when to return to the clinic for a follow-up visit. Patient educated on when it is appropriate to go to the emergency department.   Time call ended:  4:10  I provided 12 minutes of  non face-to-face time during this encounter.    Maryanna-Margaret Hassell Done, FNP

## 2022-05-21 NOTE — Patient Instructions (Signed)

## 2022-06-15 ENCOUNTER — Other Ambulatory Visit: Payer: Self-pay

## 2022-06-15 DIAGNOSIS — L2083 Infantile (acute) (chronic) eczema: Secondary | ICD-10-CM

## 2022-06-15 MED ORDER — PREDNISONE 10 MG (21) PO TBPK: ORAL_TABLET | ORAL | 0 refills | Status: DC

## 2022-08-31 ENCOUNTER — Encounter: Payer: Self-pay | Admitting: Family

## 2022-08-31 ENCOUNTER — Ambulatory Visit: Payer: BC Managed Care – PPO | Admitting: Family

## 2022-08-31 VITALS — BP 116/80 | HR 80 | Temp 97.9°F | Ht 64.0 in | Wt 145.2 lb

## 2022-08-31 DIAGNOSIS — W57XXXA Bitten or stung by nonvenomous insect and other nonvenomous arthropods, initial encounter: Secondary | ICD-10-CM | POA: Diagnosis not present

## 2022-08-31 DIAGNOSIS — S20462A Insect bite (nonvenomous) of left back wall of thorax, initial encounter: Secondary | ICD-10-CM | POA: Diagnosis not present

## 2022-08-31 DIAGNOSIS — R21 Rash and other nonspecific skin eruption: Secondary | ICD-10-CM | POA: Diagnosis not present

## 2022-08-31 MED ORDER — DOXYCYCLINE HYCLATE 100 MG PO TABS: 100.0000 mg | ORAL_TABLET | Freq: Two times a day (BID) | ORAL | 0 refills | Status: DC

## 2022-08-31 NOTE — Progress Notes (Signed)
   Subjective:    Patient ID: Janet White, female    DOB: 01/16/1996, 27 y.o.   MRN: [id number]  Chief Complaint  Patient presents with   spot on back   PT presents to the office today for "bump" on her upper back that she noticed Monday. She was getting a dress altered and her mom noticed. It has not become increased redness, mild tenderness. Denies any injury.  HPI    Review of Systems  All other systems reviewed and are negative.      Objective:   Physical Exam Vitals reviewed.  Constitutional:      General: She is not in acute distress.    Appearance: She is well-developed.  HENT:     Head: Normocephalic and atraumatic.  Eyes:     Pupils: Pupils are equal, round, and reactive to light.  Neck:     Thyroid: No thyromegaly.  Cardiovascular:     Rate and Rhythm: Normal rate and regular rhythm.     Heart sounds: Normal heart sounds. No murmur heard. Pulmonary:     Effort: Pulmonary effort is normal. No respiratory distress.     Breath sounds: Normal breath sounds. No wheezing.  Abdominal:     General: Bowel sounds are normal. There is no distension.     Palpations: Abdomen is soft.     Tenderness: There is no abdominal tenderness.  Musculoskeletal:        General: No tenderness. Normal range of motion.     Cervical back: Normal range of motion and neck supple.  Skin:    General: Skin is warm and dry.     Findings: Rash present.     Comments: Bullseye rash approx [id number]   Neurological:     Mental Status: She is alert and oriented to person, place, and time.     Cranial Nerves: No cranial nerve deficit.     Deep Tendon Reflexes: Reflexes are normal and symmetric.  Psychiatric:        Behavior: Behavior normal.        Thought Content: Thought content normal.        Judgment: Judgment normal.           Assessment & Plan:   Janet White comes in today with chief complaint of spot on back   Diagnosis and orders addressed:  1. Rash - doxycycline  (VIBRA-TABS) 100 MG tablet; Take 1 tablet (100 mg total) by mouth 2 (two) times daily.  Dispense: 42 tablet; Refill: 0  2. Insect bite of left back wall of thorax, initial encounter - doxycycline (VIBRA-TABS) 100 MG tablet; Take 1 tablet (100 mg total) by mouth 2 (two) times daily.  Dispense: 42 tablet; Refill: 0   Will treat as tick bite given rash -Pt to report any new fever, joint pain, or rash -Wear protective clothing while outside- Long sleeves and long pants -Put insect repellent on all exposed skin and along clothing -Take a shower as soon as possible after being outside -Follow up if symptoms worsen or do not improve    Evelina Dun, FNP

## 2022-08-31 NOTE — Patient Instructions (Signed)
Tick Bite Information, Adult  Ticks are insects that draw blood for food. They climb onto people and animals that brush against the leaves and grasses that they live in. They then bite and attach to the skin. Most ticks are harmless, but some ticks may carry germs that can cause disease. These germs are spread to a person through a bite. To lower your risk of getting a disease from a tick bite, make sure you: Take steps to prevent tick bites. Check for ticks after being outdoors where ticks live. Watch for symptoms of disease if a tick attached to you or if you think a tick bit you. How can I prevent tick bites? Take these steps to help prevent tick bites when you go outdoors in an area where ticks live: Before you go outdoors: Wear long sleeves and long pants to protect your skin from ticks. Wear light-colored clothing so you can see ticks easier. Tuck your pant legs into your socks. Apply insect repellent that has DEET (20% or higher), picaridin, or IR3535 in it to the following areas: Any bare skin. Avoid areas around the eyes and mouth. Edges of clothing, like the top of your boots, the bottom of your pant legs, and your sleeve cuffs. Consider applying an insect repellant that contains permethrin. Follow the instructions on the label. Do not apply permethrin directly to the skin. Instead, apply to the following areas: Clothing and shoes. Outdoor gear and tents. When you are outdoors: Avoid walking through areas with long grass. If you are walking on a trail, stay in the middle of the trail so your skin, hair, and clothing do not touch the bushes. Check for ticks on your clothing, hair, and skin often while you are outdoors. Check again before you go inside. When you go indoors: Check your clothing for ticks. Tumble dry clothes in a dryer on high heat for at least 10 minutes. If clothes are damp, additional time may be needed. If clothes require washing, use hot water. Check your gear and  pets. Shower soon after being outdoors. Check your body for ticks. Do a full body check using a mirror. Be sure to check your scalp, neck, armpits, waist, groin, and joint areas. These are the spots where ticks attach themselves most often. What is the best way to remove a tick?  Remove the tick as soon as possible. Removing it can prevent germs from passing to your body. Do not remove the tick with your bare fingers. Do not try to remove a tick with heat, alcohol, petroleum jelly, or fingernail polish. These things can cause the tick to salivate and regurgitate into your bloodstream, increasing your risk of getting a disease. To remove a tick that is crawling on your skin: Go outside and brush the tick off. Use tape or a lint roller. To remove a tick that is attached to your skin: Wash your hands. If you have gloves, put them on. Use a fine-tipped tweezer, curved forceps, or a tick-removal tool to gently grasp the tick as close to your skin and the tick's head as possible. Gently pull with a steady, upward, and even pressure until the tick lets go. While removing the tick: Take care to keep the tick's head attached to its body. Do not twist or jerk the tick. This can make the tick's head or mouth parts break off and stay in your skin. If this happens, try to remove the mouth parts with tweezers. If you cannot remove them, leave   the area alone and let the skin heal. Do not squeeze or crush the tick's body. This could force disease-carrying fluids from the tick into your body. What should I do after removing a tick? Clean the bite area and your hands with soap and water, rubbing alcohol, or an iodine scrub. If an antiseptic cream or ointment is available, put a small amount on the bite area. Wash and disinfect any tools that you used to remove the tick. How should I dispose of a tick? To dispose of a live tick, use one of these methods: Place it in rubbing alcohol. Place it in a sealed bag  or container, and throw it away. Wrap it tightly in tape, and throw it away. Flush it down the toilet. Where to find more information Centers for Disease Control and Prevention: cdc.gov/ticks [place] Environmental Protection Agency: epa.gov/insect-repellents Contact a health care provider if: You have symptoms of a disease after a tick bite. Symptoms of a tick-borne disease can occur from moments after the tick bites to 30 days after a tick is removed. Symptoms include: Fever or chills. A red rash that makes a circle (bull's-eye rash) in the bite area. Redness and swelling in the bite area. Headache or stiff neck. Muscle, joint, or bone pain. Abnormal tiredness. Numbness in your legs or trouble walking or moving your legs. Tender or swollen lymph glands. Abdominal pain, vomiting, diarrhea, or weight loss. Get help right away if: You are not able to remove a tick. You have muscle weakness or paralysis. Your symptoms get worse or you experience new symptoms. You find an engorged tick on your skin and you are in an area where there is a higher risk of disease from ticks. Summary Ticks may carry germs that can spread to a person through a bite. These germs can cause disease. Wear protective clothing and use insect repellent to prevent tick bites. Follow the instructions on the label. If you find a tick on your body, remove it as soon as possible. If the tick is attached, do not try to remove it with heat, alcohol, petroleum jelly, or fingernail polish. If you have symptoms of a disease after being bitten by a tick, contact a health care provider. This information is not intended to replace advice given to you by your health care provider. Make sure you discuss any questions you have with your health care provider. Document Revised: 09/04/2021 Document Reviewed: 09/04/2021 Elsevier Patient Education  2023 [employer]  

## 2022-09-18 ENCOUNTER — Other Ambulatory Visit: Payer: Self-pay | Admitting: Family

## 2022-09-18 DIAGNOSIS — S20462A Insect bite (nonvenomous) of left back wall of thorax, initial encounter: Secondary | ICD-10-CM

## 2022-09-18 DIAGNOSIS — R21 Rash and other nonspecific skin eruption: Secondary | ICD-10-CM

## 2023-01-29 DIAGNOSIS — Z79899 Other long term (current) drug therapy: Secondary | ICD-10-CM | POA: Diagnosis not present

## 2023-01-29 DIAGNOSIS — L089 Local infection of the skin and subcutaneous tissue, unspecified: Secondary | ICD-10-CM | POA: Diagnosis not present

## 2023-01-29 DIAGNOSIS — L0889 Other specified local infections of the skin and subcutaneous tissue: Secondary | ICD-10-CM | POA: Diagnosis not present

## 2023-01-29 DIAGNOSIS — L209 Atopic dermatitis, unspecified: Secondary | ICD-10-CM | POA: Diagnosis not present

## 2023-01-29 DIAGNOSIS — L2089 Other atopic dermatitis: Secondary | ICD-10-CM | POA: Diagnosis not present

## 2023-02-07 DIAGNOSIS — L309 Dermatitis, unspecified: Secondary | ICD-10-CM | POA: Diagnosis not present

## 2023-02-07 DIAGNOSIS — L03116 Cellulitis of left lower limb: Secondary | ICD-10-CM | POA: Diagnosis not present

## 2023-02-07 DIAGNOSIS — L03115 Cellulitis of right lower limb: Secondary | ICD-10-CM | POA: Diagnosis not present

## 2023-02-08 DIAGNOSIS — L309 Dermatitis, unspecified: Secondary | ICD-10-CM | POA: Diagnosis not present

## 2023-02-08 DIAGNOSIS — L03119 Cellulitis of unspecified part of limb: Secondary | ICD-10-CM | POA: Diagnosis not present

## 2023-02-08 DIAGNOSIS — Z87891 Personal history of nicotine dependence: Secondary | ICD-10-CM | POA: Diagnosis not present

## 2023-02-08 DIAGNOSIS — L03116 Cellulitis of left lower limb: Secondary | ICD-10-CM | POA: Diagnosis not present

## 2023-02-08 DIAGNOSIS — L03115 Cellulitis of right lower limb: Secondary | ICD-10-CM | POA: Diagnosis not present

## 2023-02-08 DIAGNOSIS — L988 Other specified disorders of the skin and subcutaneous tissue: Secondary | ICD-10-CM | POA: Diagnosis not present

## 2023-02-08 DIAGNOSIS — T370X5A Adverse effect of sulfonamides, initial encounter: Secondary | ICD-10-CM | POA: Diagnosis not present

## 2023-03-06 DIAGNOSIS — L309 Dermatitis, unspecified: Secondary | ICD-10-CM | POA: Diagnosis not present

## 2023-03-06 DIAGNOSIS — B958 Unspecified staphylococcus as the cause of diseases classified elsewhere: Secondary | ICD-10-CM | POA: Diagnosis not present

## 2023-03-06 DIAGNOSIS — Z1330 Encounter for screening examination for mental health and behavioral disorders, unspecified: Secondary | ICD-10-CM | POA: Diagnosis not present

## 2023-03-06 DIAGNOSIS — Z881 Allergy status to other antibiotic agents status: Secondary | ICD-10-CM | POA: Diagnosis not present

## 2023-03-07 DIAGNOSIS — B958 Unspecified staphylococcus as the cause of diseases classified elsewhere: Secondary | ICD-10-CM | POA: Diagnosis not present

## 2023-03-07 DIAGNOSIS — L089 Local infection of the skin and subcutaneous tissue, unspecified: Secondary | ICD-10-CM | POA: Diagnosis not present

## 2023-03-25 ENCOUNTER — Encounter: Payer: Self-pay | Admitting: Family Medicine

## 2023-03-25 ENCOUNTER — Telehealth: Payer: Self-pay

## 2023-03-25 DIAGNOSIS — J069 Acute upper respiratory infection, unspecified: Secondary | ICD-10-CM | POA: Diagnosis not present

## 2023-03-25 MED ORDER — NIRMATRELVIR/RITONAVIR (PAXLOVID)TABLET: 3.0000 | ORAL_TABLET | Freq: Two times a day (BID) | ORAL | 0 refills | Status: AC

## 2023-03-25 NOTE — Telephone Encounter (Signed)
Pt tested positive on a at home test today - states her symptoms started yesterday , she did do a virtual visit on echart yesturday , states she has been running a fever , body aches , cough , congestion and sore throat

## 2023-04-11 DIAGNOSIS — L2084 Intrinsic (allergic) eczema: Secondary | ICD-10-CM | POA: Diagnosis not present

## 2023-06-10 DIAGNOSIS — L814 Other melanin hyperpigmentation: Secondary | ICD-10-CM | POA: Diagnosis not present

## 2023-06-10 DIAGNOSIS — L0889 Other specified local infections of the skin and subcutaneous tissue: Secondary | ICD-10-CM | POA: Diagnosis not present

## 2023-06-10 DIAGNOSIS — L089 Local infection of the skin and subcutaneous tissue, unspecified: Secondary | ICD-10-CM | POA: Diagnosis not present

## 2023-06-10 DIAGNOSIS — B078 Other viral warts: Secondary | ICD-10-CM | POA: Diagnosis not present

## 2023-06-10 DIAGNOSIS — L2089 Other atopic dermatitis: Secondary | ICD-10-CM | POA: Diagnosis not present

## 2023-08-03 DIAGNOSIS — M545 Low back pain, unspecified: Secondary | ICD-10-CM | POA: Diagnosis not present

## 2023-08-03 DIAGNOSIS — S39012A Strain of muscle, fascia and tendon of lower back, initial encounter: Secondary | ICD-10-CM | POA: Diagnosis not present

## 2024-01-29 DIAGNOSIS — Z3169 Encounter for other general counseling and advice on procreation: Secondary | ICD-10-CM | POA: Diagnosis not present

## 2024-01-29 DIAGNOSIS — Z01419 Encounter for gynecological examination (general) (routine) without abnormal findings: Secondary | ICD-10-CM | POA: Diagnosis not present

## 2024-01-29 DIAGNOSIS — Z31430 Encounter of female for testing for genetic disease carrier status for procreative management: Secondary | ICD-10-CM | POA: Diagnosis not present

## 2024-01-29 DIAGNOSIS — Z124 Encounter for screening for malignant neoplasm of cervix: Secondary | ICD-10-CM | POA: Diagnosis not present

## 2024-04-13 DIAGNOSIS — L2084 Intrinsic (allergic) eczema: Secondary | ICD-10-CM | POA: Diagnosis not present

## 2024-04-13 DIAGNOSIS — L235 Allergic contact dermatitis due to other chemical products: Secondary | ICD-10-CM | POA: Diagnosis not present

## 2024-05-05 ENCOUNTER — Ambulatory Visit (INDEPENDENT_AMBULATORY_CARE_PROVIDER_SITE_OTHER): Admitting: Family

## 2024-05-05 ENCOUNTER — Encounter: Payer: Self-pay | Admitting: Family

## 2024-05-05 VITALS — BP 107/74 | HR 89 | Temp 97.6°F | Ht 64.0 in | Wt 155.0 lb

## 2024-05-05 DIAGNOSIS — J019 Acute sinusitis, unspecified: Secondary | ICD-10-CM

## 2024-05-05 MED ORDER — AMOXICILLIN-POT CLAVULANATE 875-125 MG PO TABS: 1.0000 | ORAL_TABLET | Freq: Two times a day (BID) | ORAL | 0 refills | Status: AC

## 2024-05-05 NOTE — Patient Instructions (Signed)

## 2024-05-05 NOTE — Progress Notes (Signed)
 Subjective:    Patient ID: Janet White, female    DOB: 1996-02-09, 28 y.o.   MRN: [id number]  Chief Complaint  Patient presents with   Cough   PT presents to the office today with sore throat, headache, sinus pressure 6 days ago. Reports she was feeling better, but yesterday started feeling worse.  Cough Associated symptoms include headaches and a sore throat. Pertinent negatives include no ear pain or shortness of breath.  Sinusitis This is a new problem. The current episode started 1 to 4 weeks ago. The problem has been gradually worsening since onset. There has been no fever. Her pain is at a severity of 4/10. The pain is mild. Associated symptoms include congestion, coughing, headaches, a hoarse voice, sinus pressure, sneezing and a sore throat. Pertinent negatives include no ear pain or shortness of breath. Past treatments include acetaminophen (mucinex and tylenol). The treatment provided mild relief.      Review of Systems  HENT:  Positive for congestion, hoarse voice, sinus pressure, sneezing and sore throat. Negative for ear pain.   Respiratory:  Positive for cough. Negative for shortness of breath.   Neurological:  Positive for headaches.  All other systems reviewed and are negative.   Social History   Socioeconomic History   Marital status: Single    Spouse name: Not on file   Number of children: Not on file   Years of education: Not on file   Highest education level: Not on file  Occupational History   Not on file  Tobacco Use   Smoking status: Every Day    Current packs/day: 0.50    Types: Cigarettes   Smokeless tobacco: Never  Vaping Use   Vaping status: Never Used  Substance and Sexual Activity   Alcohol use: Yes    Comment: occasional   Drug use: Yes    Types: Marijuana   Sexual activity: Not on file  Other Topics Concern   Not on file  Social History Narrative   Not on file   Social Drivers of Health   Financial Resource Strain: Low Risk   (03/06/2023)   Received from [facility]   Overall Financial Resource Strain (CARDIA)    Difficulty of Paying Living Expenses: Not hard at all  Food Insecurity: No Food Insecurity (03/06/2023)   Received from [facility]   Hunger Vital Sign    Within the past 12 months, you worried that your food would run out before you got the money to buy more.: Never true    Within the past 12 months, the food you bought just didn't last and you didn't have money to get more.: Never true  Transportation Needs: No Transportation Needs (03/06/2023)   Received from [facility] - Transportation    Lack of Transportation (Medical): No    Lack of Transportation (Non-Medical): No  Physical Activity: Not on file  Stress: Not on file  Social Connections: Not on file   Family History  Problem Relation Age of Onset   Cancer Mother        breast   Cancer Maternal Grandmother        breast ca   Allergic rhinitis Father    Angioedema Neg Hx    Asthma Neg Hx    Atopy Neg Hx    Eczema Neg Hx    Immunodeficiency Neg Hx    Urticaria Neg Hx         Objective:   Physical Exam Vitals reviewed.  Constitutional:      General: She is not in acute distress.    Appearance: She is well-developed.  HENT:     Head: Normocephalic and atraumatic.     Right Ear: Tympanic membrane normal.     Left Ear: Tympanic membrane normal.     Nose:     Right Turbinates: Swollen.     Left Turbinates: Swollen.     Right Sinus: Frontal sinus tenderness present.     Left Sinus: Frontal sinus tenderness present.     Mouth/Throat:     Pharynx: Posterior oropharyngeal erythema present.  Eyes:     Pupils: Pupils are equal, round, and reactive to light.  Neck:     Thyroid : No thyromegaly.  Cardiovascular:     Rate and Rhythm: Normal rate and regular rhythm.     Heart sounds: Normal heart sounds. No murmur heard. Pulmonary:     Effort: Pulmonary effort is normal. No respiratory distress.     Breath sounds:  Normal breath sounds. No wheezing.  Abdominal:     General: Bowel sounds are normal. There is no distension.     Palpations: Abdomen is soft.     Tenderness: There is no abdominal tenderness.  Musculoskeletal:        General: No tenderness. Normal range of motion.     Cervical back: Normal range of motion and neck supple.  Skin:    General: Skin is warm and dry.  Neurological:     Mental Status: She is alert and oriented to person, place, and time.     Cranial Nerves: No cranial nerve deficit.     Deep Tendon Reflexes: Reflexes are normal and symmetric.  Psychiatric:        Behavior: Behavior normal.        Thought Content: Thought content normal.        Judgment: Judgment normal.       BP 107/74   Pulse 89   Temp 97.6 F (36.4 C) (Temporal)   Ht 5' 4 (1.626 m)   Wt 155 lb (70.3 kg)   SpO2 98%   BMI 26.61 kg/m      Assessment & Plan:  Henley Blyth comes in today with chief complaint of Cough   Diagnosis and orders addressed:  1. Acute non-recurrent sinusitis, unspecified location (Primary) - Take meds as prescribed - Use a cool mist humidifier  -Use saline nose sprays frequently -Force fluids -For any cough or congestion  Use plain Mucinex- regular strength or max strength is fine -For fever or aces or pains- take tylenol or ibuprofen. -Throat lozenges if help -Follow up if symptoms worsen or do not improve  - amoxicillin -clavulanate (AUGMENTIN) 875-125 MG tablet; Take 1 tablet by mouth 2 (two) times daily.  Dispense: 14 tablet; Refill: 0      Bari Learn, FNP

## 2024-06-24 ENCOUNTER — Other Ambulatory Visit: Payer: Self-pay | Admitting: Family Medicine

## 2024-06-24 DIAGNOSIS — R0989 Other specified symptoms and signs involving the circulatory and respiratory systems: Secondary | ICD-10-CM

## 2024-06-24 DIAGNOSIS — R112 Nausea with vomiting, unspecified: Secondary | ICD-10-CM

## 2024-06-24 MED ORDER — ONDANSETRON HCL 4 MG PO TABS: 4.0000 mg | ORAL_TABLET | Freq: Three times a day (TID) | ORAL | 0 refills | Status: AC | PRN

## 2024-06-24 MED ORDER — OSELTAMIVIR PHOSPHATE 75 MG PO CAPS: 75.0000 mg | ORAL_CAPSULE | Freq: Two times a day (BID) | ORAL | 0 refills | Status: AC
